# Patient Record
Sex: Female | Born: 1950
Health system: Southern US, Community
[De-identification: ages and names within clinical notes are randomized; demographics above are authoritative.]

## PROBLEM LIST (undated history)

## (undated) DIAGNOSIS — B019 Varicella without complication: Secondary | ICD-10-CM

## (undated) DIAGNOSIS — R011 Cardiac murmur, unspecified: Secondary | ICD-10-CM

## (undated) DIAGNOSIS — E7800 Pure hypercholesterolemia, unspecified: Secondary | ICD-10-CM

## (undated) DIAGNOSIS — N390 Urinary tract infection, site not specified: Secondary | ICD-10-CM

## (undated) HISTORY — PX: OTHER SURGICAL HISTORY: SHX169

## (undated) HISTORY — DX: Cardiac murmur, unspecified: R01.1

## (undated) HISTORY — DX: Pure hypercholesterolemia, unspecified: E78.00

## (undated) HISTORY — DX: Varicella without complication: B01.9

## (undated) HISTORY — DX: Urinary tract infection, site not specified: N39.0

---

## 1994-03-14 HISTORY — PX: ABDOMINAL HYSTERECTOMY: SHX81

## 2004-04-30 ENCOUNTER — Ambulatory Visit: Payer: Self-pay | Admitting: Gastroenterology

## 2005-10-04 ENCOUNTER — Ambulatory Visit: Payer: Self-pay

## 2006-07-19 ENCOUNTER — Emergency Department: Payer: Self-pay | Admitting: Emergency Medicine

## 2012-05-29 ENCOUNTER — Encounter: Payer: Self-pay | Admitting: Adult Health

## 2012-05-29 ENCOUNTER — Ambulatory Visit (INDEPENDENT_AMBULATORY_CARE_PROVIDER_SITE_OTHER): Payer: 59 | Admitting: Adult Health

## 2012-05-29 VITALS — BP 148/86 | HR 86 | Temp 98.4°F | Resp 14 | Ht 66.0 in | Wt 204.0 lb

## 2012-05-29 DIAGNOSIS — G629 Polyneuropathy, unspecified: Secondary | ICD-10-CM

## 2012-05-29 DIAGNOSIS — R200 Anesthesia of skin: Secondary | ICD-10-CM | POA: Insufficient documentation

## 2012-05-29 DIAGNOSIS — R209 Unspecified disturbances of skin sensation: Secondary | ICD-10-CM

## 2012-05-29 DIAGNOSIS — Z1239 Encounter for other screening for malignant neoplasm of breast: Secondary | ICD-10-CM

## 2012-05-29 DIAGNOSIS — Z0000 Encounter for general adult medical examination without abnormal findings: Secondary | ICD-10-CM

## 2012-05-29 DIAGNOSIS — Z1211 Encounter for screening for malignant neoplasm of colon: Secondary | ICD-10-CM

## 2012-05-29 DIAGNOSIS — G589 Mononeuropathy, unspecified: Secondary | ICD-10-CM

## 2012-05-29 DIAGNOSIS — Z13820 Encounter for screening for osteoporosis: Secondary | ICD-10-CM

## 2012-05-29 HISTORY — DX: Anesthesia of skin: R20.0

## 2012-05-29 MED ORDER — FLUTICASONE PROPIONATE 50 MCG/ACT NA SUSP: 2.0000 | Freq: Every day | NASAL | Status: AC

## 2012-05-29 NOTE — Patient Instructions (Addendum)
  Thank you for choosing [place] at [facility] for your health care needs.  Please return at your earliest convenience for fasting labs.  Once the results are available I will contact you.  Our office will contact you with the following:   Referral to GI for screening colonoscopy  Appointment for Mammogram  Appointment for Bone Density Exam

## 2012-05-29 NOTE — Assessment & Plan Note (Signed)
Numbness and tingling bilateral feet. Mainly worse in the evening. Will check B12 level

## 2012-05-29 NOTE — Assessment & Plan Note (Signed)
Mammography ordered

## 2012-05-29 NOTE — Progress Notes (Signed)
  Subjective:    Patient ID: Jody Huffman, female    DOB: 17-Feb-1951, 62 y.o.   MRN: [id number]  HPI  Patient is a pleasant 62 year old female who presents to clinic to establish care. She reports she has not seen a provider in several years. 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 She is feeling well overall.  Health Maintenance:  Flu shot - 04/2012  Colonoscopy - greater than 10 years. Needs referral  Tdap - Needs  Mammogram - Needs  PAP - Normal. Patient had total hysterectomy 1996  Exercise - Uses stationary bicycle and walks when weather permits.  Depression Screen - No s/s of hopelessness, no distress, no anhedonia. Patient denies any suicidal ideations.    Review of Systems  Constitutional: Negative for appetite change and fatigue.  HENT: Positive for rhinorrhea, voice change, postnasal drip and sinus pressure. Negative for sore throat and trouble swallowing.        Ear pressure  Eyes: Positive for itching.       Watery eyes during allergy season  Respiratory: Negative for shortness of breath and wheezing.   Cardiovascular: Negative for chest pain and leg swelling.  Gastrointestinal: Positive for constipation. Negative for nausea, vomiting, abdominal pain, diarrhea and blood in stool.       External hemorrhoids  Endocrine: Negative for cold intolerance, heat intolerance, polydipsia, polyphagia and polyuria.  Genitourinary: Negative for dysuria, urgency, hematuria, vaginal bleeding and difficulty urinating.  Musculoskeletal: Negative.   Skin: Negative.   Allergic/Immunologic: Positive for environmental allergies. Negative for food allergies and immunocompromised state.  Neurological: Positive for numbness. Negative for dizziness, light-headedness and headaches.       Tingling bilateral feet.  Hematological: Negative.     BP 148/86  Pulse 86  Temp(Src) 98.4 F (36.9 C)  Resp 14  Ht 5\' 6"  (1.676 m)  Wt 204 lb (92.534 kg)  BMI 32.94  kg/m2  SpO2 99%     Objective:   Physical Exam  Constitutional: She is oriented to person, place, and time. She appears well-developed and well-nourished. No distress.  HENT:  Head: Normocephalic and atraumatic.  Right Ear: External ear normal.  Left Ear: External ear normal.  Nose: Nose normal.  Mouth/Throat: Oropharynx is clear and moist.  Eyes: Conjunctivae and EOM are normal. Pupils are equal, round, and reactive to light.  Neck: Normal range of motion. Neck supple. No tracheal deviation present. No thyromegaly present.  Cardiovascular: Normal rate, regular rhythm, normal heart sounds and intact distal pulses.  Exam reveals no gallop.   No murmur heard. Pulmonary/Chest: Effort normal and breath sounds normal. No respiratory distress. She has no wheezes. She has no rales.  Abdominal: Soft. Bowel sounds are normal. She exhibits no distension. There is no tenderness. There is no rebound and no guarding.  Musculoskeletal: Normal range of motion. She exhibits no edema and no tenderness.  Lymphadenopathy:    She has no cervical adenopathy.  Neurological: She is alert and oriented to person, place, and time. No cranial nerve deficit. Coordination normal.  Skin: Skin is warm and dry. She is not diaphoretic.  Psychiatric: She has a normal mood and affect. Her behavior is normal. Judgment and thought content normal.          Assessment & Plan:

## 2012-05-29 NOTE — Assessment & Plan Note (Signed)
Greater than 10 years since previous colonoscopy. Refer to GI for screening.

## 2012-05-29 NOTE — Assessment & Plan Note (Addendum)
Normal physical exam. Labs: CBC with differential, comprehensive metabolic panel, TSH, lipid panel, vitamin D level. Patient is not fasting and will be returning for fasting labs. I am also referring patient to GI for screening colonoscopy. Mammogram ordered. Patient will schedule appointment for pelvic. She no longer needs Pap smears.

## 2012-06-14 ENCOUNTER — Other Ambulatory Visit (INDEPENDENT_AMBULATORY_CARE_PROVIDER_SITE_OTHER): Payer: 59

## 2012-06-14 DIAGNOSIS — G589 Mononeuropathy, unspecified: Secondary | ICD-10-CM

## 2012-06-14 DIAGNOSIS — Z0000 Encounter for general adult medical examination without abnormal findings: Secondary | ICD-10-CM

## 2012-06-14 DIAGNOSIS — G629 Polyneuropathy, unspecified: Secondary | ICD-10-CM

## 2012-06-14 LAB — COMPREHENSIVE METABOLIC PANEL
ALT: 45 U/L — ABNORMAL HIGH (ref 0–35)
AST: 50 U/L — ABNORMAL HIGH (ref 0–37)
Calcium: 9.2 mg/dL (ref 8.4–10.5)
Chloride: 99 mEq/L (ref 96–112)
Creatinine, Ser: 0.7 mg/dL (ref 0.4–1.2)
Potassium: 3.6 mEq/L (ref 3.5–5.1)

## 2012-06-14 LAB — CBC WITH DIFFERENTIAL/PLATELET
Basophils Absolute: 0 10*3/uL (ref 0.0–0.1)
Eosinophils Absolute: 0.1 10*3/uL (ref 0.0–0.7)
Lymphocytes Relative: 41.2 % (ref 12.0–46.0)
MCHC: 32.8 g/dL (ref 30.0–36.0)
Neutro Abs: 2.6 10*3/uL (ref 1.4–7.7)
Neutrophils Relative %: 51.3 % (ref 43.0–77.0)
Platelets: 273 10*3/uL (ref 150.0–400.0)
RDW: 14.8 % — ABNORMAL HIGH (ref 11.5–14.6)

## 2012-06-14 LAB — LIPID PANEL
Total CHOL/HDL Ratio: 4
Triglycerides: 116 mg/dL (ref 0.0–149.0)

## 2012-06-14 LAB — LDL CHOLESTEROL, DIRECT: Direct LDL: 185.9 mg/dL

## 2012-06-15 LAB — VITAMIN D 25 HYDROXY (VIT D DEFICIENCY, FRACTURES): Vit D, 25-Hydroxy: 47 ng/mL (ref 30–89)

## 2012-07-03 ENCOUNTER — Ambulatory Visit: Payer: Self-pay | Admitting: Internal Medicine

## 2012-07-08 ENCOUNTER — Telehealth: Payer: Self-pay | Admitting: Internal Medicine

## 2012-07-12 ENCOUNTER — Telehealth: Payer: Self-pay | Admitting: Adult Health

## 2012-07-12 NOTE — Telephone Encounter (Signed)
Bone Density Scan was normal. Continue calcium and vitamin D supplement.

## 2012-07-12 NOTE — Telephone Encounter (Signed)
Pt notified of results

## 2012-07-13 ENCOUNTER — Other Ambulatory Visit: Payer: Self-pay | Admitting: Adult Health

## 2012-07-13 ENCOUNTER — Telehealth: Payer: Self-pay | Admitting: *Deleted

## 2012-07-13 DIAGNOSIS — R899 Unspecified abnormal finding in specimens from other organs, systems and tissues: Secondary | ICD-10-CM

## 2012-07-13 NOTE — Telephone Encounter (Signed)
PT is coming in for labs Monday 05.05.2014 what labs and dx would you like?  Thank you

## 2012-07-13 NOTE — Telephone Encounter (Signed)
Repeat cmet - abnormal lab result. I already entered the order. Thanks.

## 2012-07-16 ENCOUNTER — Other Ambulatory Visit (INDEPENDENT_AMBULATORY_CARE_PROVIDER_SITE_OTHER): Payer: 59

## 2012-07-16 DIAGNOSIS — R6889 Other general symptoms and signs: Secondary | ICD-10-CM

## 2012-07-16 DIAGNOSIS — R899 Unspecified abnormal finding in specimens from other organs, systems and tissues: Secondary | ICD-10-CM

## 2012-07-16 LAB — COMPREHENSIVE METABOLIC PANEL
ALT: 26 U/L (ref 0–35)
Albumin: 4.1 g/dL (ref 3.5–5.2)
CO2: 26 mEq/L (ref 19–32)
GFR: 104.58 mL/min (ref >60.00)
Glucose, Bld: 111 mg/dL — ABNORMAL HIGH (ref 70–99)
Potassium: 4.1 mEq/L (ref 3.5–5.1)
Sodium: 138 mEq/L (ref 135–145)
Total Bilirubin: 0.6 mg/dL (ref 0.3–1.2)
Total Protein: 7.7 g/dL (ref 6.0–8.3)

## 2012-07-17 ENCOUNTER — Other Ambulatory Visit: Payer: Self-pay | Admitting: Adult Health

## 2012-07-17 DIAGNOSIS — R7309 Other abnormal glucose: Secondary | ICD-10-CM

## 2012-07-30 ENCOUNTER — Encounter: Payer: Self-pay | Admitting: Internal Medicine

## 2012-08-03 ENCOUNTER — Other Ambulatory Visit (INDEPENDENT_AMBULATORY_CARE_PROVIDER_SITE_OTHER): Payer: 59

## 2012-08-03 DIAGNOSIS — R7309 Other abnormal glucose: Secondary | ICD-10-CM

## 2012-08-03 LAB — HEMOGLOBIN A1C: Hgb A1c MFr Bld: 6.5 % (ref 4.6–6.5)

## 2012-08-08 ENCOUNTER — Encounter: Payer: Self-pay | Admitting: Internal Medicine

## 2012-11-20 ENCOUNTER — Ambulatory Visit (INDEPENDENT_AMBULATORY_CARE_PROVIDER_SITE_OTHER): Payer: 59 | Admitting: Adult Health

## 2012-11-20 ENCOUNTER — Encounter: Payer: Self-pay | Admitting: Adult Health

## 2012-11-20 VITALS — BP 112/78 | HR 79 | Temp 98.3°F | Resp 12 | Ht 66.0 in | Wt 193.0 lb

## 2012-11-20 DIAGNOSIS — M25519 Pain in unspecified shoulder: Secondary | ICD-10-CM

## 2012-11-20 HISTORY — DX: Pain in unspecified shoulder: M25.519

## 2012-11-20 MED ORDER — METHOCARBAMOL 750 MG PO TABS: 750.0000 mg | ORAL_TABLET | Freq: Three times a day (TID) | ORAL | Status: DC

## 2012-11-20 MED ORDER — NAPROXEN 500 MG PO TABS: 500.0000 mg | ORAL_TABLET | Freq: Two times a day (BID) | ORAL | Status: DC

## 2012-11-20 NOTE — Assessment & Plan Note (Addendum)
Bilateral shoulder pain with radiating to elbow with abduction of arms. Suspect tendonitis. Start Naproxen and Robaxin. Ice area for 15 min. 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 Will have ortho see her to evaluate if steroid injection may benefit. 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

## 2012-11-20 NOTE — Progress Notes (Signed)
  Subjective:    Patient ID: Jody Huffman, female    DOB: 08-30-1950, 62 y.o.   MRN: [id number]  HPI  Jody Huffman presents with bilateral shoulder pain which radiate down to the elbow. 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 The pain has been ongoing for approximately one month. This pain did not begin until she started doing this job. She normally works in the [employer]. Pain has been waking her up at night. She has used heat pads and has also taken ibuprofen but only takes the edge off. Pain not fully relieved.   Current Outpatient Prescriptions on File Prior to Visit  Medication Sig Dispense Refill  . Calcium Carbonate-Vitamin D (CALCIUM-VITAMIN D3 PO) Take by mouth.      . Chromium-Cinnamon (CINNAMON PLUS CHROMIUM PO) Take by mouth.      . fluticasone (FLONASE) 50 MCG/ACT nasal spray Place 2 sprays into the nose daily.  16 g  6  . Multiple Vitamins-Minerals (WOMENS MULTI VITAMIN & MINERAL PO) Take by mouth.      . OMEGA-3 KRILL OIL PO Take by mouth.       No current facility-administered medications on file prior to visit.    Review of Systems  Musculoskeletal:       Bilateral shoulder pain with radiation into the elbow.      BP 112/78  Pulse 79  Temp(Src) 98.3 F (36.8 C) (Oral)  Resp 12  Ht 5\' 6"  (1.676 m)  Wt 193 lb (87.544 kg)  BMI 31.17 kg/m2  SpO2 98%     Objective:   Physical Exam  Constitutional: She is oriented to person, place, and time. She appears well-developed and well-nourished.  Cardiovascular: Normal rate and regular rhythm.   Pulmonary/Chest: Effort normal. No respiratory distress.  Musculoskeletal:  Pain with abduction. Pain was at shoulder area. This is occuring bilaterally.  Neurological: She is alert and oriented to person, place, and time.  Psychiatric: She has a normal mood and affect. Her behavior is normal. Judgment and thought content normal.           Assessment & Plan:

## 2012-11-20 NOTE — Patient Instructions (Addendum)
Rotator Cuff Tendonitis  The rotator cuff is the collection of all the muscles and tendons (the supraspinatus, infraspinatus, subscapularis, and teres minor muscles and their tendons) that help your shoulder stay in place. This unit holds the head of the upper arm bone (humerus) in the cup (fossa) of the shoulder blade (scapula). Basically, it connects the arm to the shoulder. Tendinitis is a swelling and irritation of the tissue, called cord like structures (tendons) that connect muscle to bone. It usually is caused by overusing the joint involved. When the tissue surrounding a tendon (the synovium) becomes inflamed, it is called tenosynovitis. This also is often the result of overuse in people whose jobs require repetitive (over and over again) types of motion. HOME CARE INSTRUCTIONS   Use a sling or splint for as long as directed by your caregiver until the pain decreases.  Apply ice to the injury for 15-20 minutes, 3-4 times per day. Put the ice in a plastic bag and place a towel between the bag of ice and your skin.  Try to avoid use other than gentle range of motion while your shoulder is painful. Use and exercise only as directed by your caregiver. Stop exercises or range of motion if pain or discomfort increases, unless directed otherwise by your caregiver.  Only take over-the-counter or prescription medicines for pain, discomfort, or fever as directed by your caregiver.  If you were give a shoulder sling and straps (immobilizer), do not remove it except as directed, or until you see a caregiver for a follow-up examination. If you need to remove it, move your arm as little as possible or as directed.  You may want to sleep on several pillows at night to lessen swelling and pain. SEEK IMMEDIATE MEDICAL CARE IF:   Pain in your shoulder increases or new pain develops in your arm, hand, or fingers and is not relieved with medications.  You develop new, unexplained symptoms, especially  increased numbness in the hands or loss of strength, or you develop any worsening of the problems which brought you in for care.  Your arm, hand, or fingers are numb or tingling.  Your arm, hand, or fingers are swollen, painful, or turn white or blue. Document Released: 05/21/2003 Document Revised: 05/23/2011 Document Reviewed: 12/27/2007 Exit[employer] Patient Information 2014 [employer].  

## 2013-01-29 ENCOUNTER — Other Ambulatory Visit: Payer: Self-pay | Admitting: Adult Health

## 2013-09-17 ENCOUNTER — Telehealth: Payer: Self-pay

## 2013-09-17 NOTE — Telephone Encounter (Signed)
Pt is scheduled at [facility] for August 4th at 4:15 for her diagnostic mammogram.  (pt's last was April 2014)

## 2013-09-18 NOTE — Telephone Encounter (Signed)
Thank you :)

## 2013-10-07 ENCOUNTER — Encounter: Payer: 59 | Admitting: Adult Health

## 2013-10-08 ENCOUNTER — Encounter: Payer: Self-pay | Admitting: Adult Health

## 2013-10-08 ENCOUNTER — Ambulatory Visit (INDEPENDENT_AMBULATORY_CARE_PROVIDER_SITE_OTHER)
Admission: RE | Admit: 2013-10-08 | Discharge: 2013-10-08 | Disposition: A | Discharge status: Home / Self Care | Payer: 59 | Source: Ambulatory Visit | Attending: Adult Health | Admitting: Adult Health

## 2013-10-08 ENCOUNTER — Ambulatory Visit (INDEPENDENT_AMBULATORY_CARE_PROVIDER_SITE_OTHER): Payer: 59 | Admitting: Adult Health

## 2013-10-08 VITALS — BP 138/85 | HR 78 | Temp 98.0°F | Resp 14 | Ht 66.5 in | Wt 194.0 lb

## 2013-10-08 DIAGNOSIS — M79609 Pain in unspecified limb: Secondary | ICD-10-CM

## 2013-10-08 DIAGNOSIS — R209 Unspecified disturbances of skin sensation: Secondary | ICD-10-CM

## 2013-10-08 DIAGNOSIS — R200 Anesthesia of skin: Secondary | ICD-10-CM

## 2013-10-08 DIAGNOSIS — M79671 Pain in right foot: Secondary | ICD-10-CM

## 2013-10-08 DIAGNOSIS — R202 Paresthesia of skin: Secondary | ICD-10-CM

## 2013-10-08 DIAGNOSIS — Z0000 Encounter for general adult medical examination without abnormal findings: Secondary | ICD-10-CM

## 2013-10-08 DIAGNOSIS — Z23 Encounter for immunization: Secondary | ICD-10-CM

## 2013-10-08 HISTORY — DX: Pain in right foot: M79.671

## 2013-10-08 MED ORDER — TETANUS-DIPHTHERIA TOXOIDS TD 5-2 LFU IM INJ
0.5000 mL | INJECTION | Freq: Once | INTRAMUSCULAR | Status: AC
  Administered 2013-10-08: 0.5 mL via INTRAMUSCULAR

## 2013-10-08 NOTE — Patient Instructions (Signed)
  [facility] for your xray of right foot.  Please return for fasting labs at your earliest convenience.  Return in 1 year for your annual physical or sooner if necessary.  You received your tetanus vaccine in clinic today.

## 2013-10-08 NOTE — Progress Notes (Signed)
Patient ID: Jody MartinezLinda A Brockel, female   DOB: 10/06/1950, [age] y.o.   MRN: [id number]   Subjective:    Patient ID: Jody MartinezLinda A Cottrill, female    DOB: 12/27/1950, 63 y.o.   MRN: [id number]  HPI  Pt is a pleasant 63 y/o female who presents for her annual physical exam. Needs tetanus vaccine. Mammogram scheduled for next month. Colonoscopy scheduled for next year. Doing well overall except for having some swelling of her right foot and ankle. Pain on the top of her right foot as well. Reports that she walks on concrete all day long. No reports of injury that she is aware of. Cannot really pinpoint when her symptoms began. Numbness and tingling of toes. Reports "strong" family hx of diabetes in her family.   Past Medical History  Diagnosis Date  . Chicken pox   . Heart murmur   . High cholesterol   . UTI (lower urinary tract infection)     Past Surgical History  Procedure Laterality Date  . Abdominal hysterectomy  1996  . Right breast cyst removed      In her 30s    Family History  Problem Relation Age of Onset  . Diabetes Mother   . Kidney disease Mother 165    dialysis  . Cancer Father [id number]    pancreatic cancer  . Kidney disease Sister 543    dialysis  . Diabetes Brother     History   Social History  . Marital Status: Divorced    Spouse Name: N/A    Number of Children: N/A  . Years of Education: N/A   Occupational History  . Not on file.   Social History Main Topics  . Smoking status: Never Smoker   . Smokeless tobacco: Not on file  . Alcohol Use: No  . Drug Use: No  . Sexual Activity: Not on file   Other Topics Concern  . Not on file   Social History Narrative  . No narrative on file    Current Outpatient Prescriptions on File Prior to Visit  Medication Sig Dispense Refill  . Calcium Carbonate-Vitamin D (CALCIUM-VITAMIN D3 PO) Take by mouth.      . cetirizine (ZYRTEC) 10 MG tablet Take 10 mg by mouth daily.      . Chromium-Cinnamon (CINNAMON PLUS CHROMIUM PO) Take by mouth.       . fluticasone (FLONASE) 50 MCG/ACT nasal spray Place 2 sprays into the nose daily.  16 g  6  . Multiple Vitamins-Minerals (WOMENS MULTI VITAMIN & MINERAL PO) Take by mouth.      . OMEGA-3 KRILL OIL PO Take by mouth.       No current facility-administered medications on file prior to visit.    Review of Systems  Constitutional: Negative.   HENT: Negative.   Eyes: Negative.   Respiratory: Negative.   Cardiovascular: Negative.   Gastrointestinal: Negative.   Endocrine: Negative.   Genitourinary: Negative.   Musculoskeletal: Positive for arthralgias.       Right foot pain, ankle swelling  Skin: Negative.   Allergic/Immunologic: Negative.   Neurological: Positive for numbness (and tingling of bilateral feet).  Hematological: Negative.   Psychiatric/Behavioral: Negative.        Objective:  BP 138/85  Pulse 78  Temp(Src) 98 F (36.7 C) (Oral)  Resp 14  Ht 5' 6.5" (1.689 m)  Wt 194 lb (87.998 kg)  BMI 30.85 kg/m2  SpO2 99%   Physical Exam  Constitutional: She is oriented to person,  place, and time. She appears well-developed and well-nourished. No distress.  HENT:  Head: Normocephalic and atraumatic.  Right Ear: External ear normal.  Left Ear: External ear normal.  Nose: Nose normal.  Mouth/Throat: Oropharynx is clear and moist.  Eyes: Conjunctivae and EOM are normal. Pupils are equal, round, and reactive to light.  Neck: Normal range of motion. Neck supple. No tracheal deviation present. No thyromegaly present.  Cardiovascular: Normal rate, regular rhythm, normal heart sounds and intact distal pulses.  Exam reveals no gallop and no friction rub.   No murmur heard. Pulmonary/Chest: Effort normal and breath sounds normal. No respiratory distress. She has no wheezes. She has no rales. Right breast exhibits no inverted nipple, no mass, no nipple discharge, no skin change and no tenderness. Left breast exhibits no inverted nipple, no mass, no nipple discharge, no skin change  and no tenderness. Breasts are symmetrical.  Abdominal: Soft. Bowel sounds are normal. She exhibits no distension and no mass. There is no tenderness. There is no rebound and no guarding.  Musculoskeletal: Normal range of motion. She exhibits edema and tenderness (right foot pain).  Right ankle swelling  Lymphadenopathy:    She has no cervical adenopathy.  Neurological: She is alert and oriented to person, place, and time. She has normal reflexes. No cranial nerve deficit. Coordination normal.  Skin: Skin is warm and dry.  Psychiatric: She has a normal mood and affect. Her behavior is normal. Judgment and thought content normal.      Assessment & Plan:   1. Routine general medical examination at a health care facility Normal physical exam including breast exam. Problems listed separately. Screenings addressed and listed separately as indicated. Check labs. - Lipid panel - CBC with Differential - TSH - Vit D  25 hydroxy (rtn osteoporosis monitoring) - Comprehensive metabolic panel - tetanus & diphtheria toxoids (adult) (TENIVAC) injection 0.5 mL; Inject 0.5 mLs into the muscle once.  2. Right foot pain Walks on concrete all day. ?stress fracture. Send for xray - DG Foot Complete Right; Future  3. Numbness and tingling Will check labs to r/o deficiency as cause of symptom - Folate - Vitamin B12 - Ferritin - Iron  4. Need for prophylactic vaccination with combined diphtheria-tetanus-pertussis (DTP) vaccine Given in clinic today. - tetanus & diphtheria toxoids (adult) (TENIVAC) injection 0.5 mL; Inject 0.5 mLs into the muscle once.

## 2013-10-08 NOTE — Progress Notes (Signed)
Pre visit review using our clinic review tool, if applicable. No additional management support is needed unless otherwise documented below in the visit note. 

## 2013-10-10 ENCOUNTER — Other Ambulatory Visit (INDEPENDENT_AMBULATORY_CARE_PROVIDER_SITE_OTHER): Payer: 59

## 2013-10-10 ENCOUNTER — Other Ambulatory Visit: Payer: Self-pay | Admitting: Adult Health

## 2013-10-10 DIAGNOSIS — R200 Anesthesia of skin: Secondary | ICD-10-CM

## 2013-10-10 DIAGNOSIS — R209 Unspecified disturbances of skin sensation: Secondary | ICD-10-CM

## 2013-10-10 DIAGNOSIS — Z0000 Encounter for general adult medical examination without abnormal findings: Secondary | ICD-10-CM

## 2013-10-10 DIAGNOSIS — R202 Paresthesia of skin: Principal | ICD-10-CM

## 2013-10-10 LAB — LIPID PANEL
CHOL/HDL RATIO: 4
Cholesterol: 254 mg/dL — ABNORMAL HIGH (ref 0–200)
HDL: 66.6 mg/dL (ref >39.00)
LDL CALC: 170 mg/dL — AB (ref 0–99)
NONHDL: 187.4
Triglycerides: 89 mg/dL (ref 0.0–149.0)
VLDL: 17.8 mg/dL (ref 0.0–40.0)

## 2013-10-10 LAB — CBC WITH DIFFERENTIAL/PLATELET
Basophils Absolute: 0 10*3/uL (ref 0.0–0.1)
Basophils Relative: 0.4 % (ref 0.0–3.0)
EOS PCT: 0 % (ref 0.0–5.0)
Eosinophils Absolute: 0 10*3/uL (ref 0.0–0.7)
HCT: 36.2 % (ref 36.0–46.0)
HEMOGLOBIN: 12.1 g/dL (ref 12.0–15.0)
LYMPHS ABS: 1.8 10*3/uL (ref 0.7–4.0)
Lymphocytes Relative: 20.9 % (ref 12.0–46.0)
MCHC: 33.5 g/dL (ref 30.0–36.0)
MCV: 91.2 fl (ref 78.0–100.0)
MONOS PCT: 4.8 % (ref 3.0–12.0)
Monocytes Absolute: 0.4 10*3/uL (ref 0.1–1.0)
NEUTROS ABS: 6.4 10*3/uL (ref 1.4–7.7)
Neutrophils Relative %: 73.9 % (ref 43.0–77.0)
Platelets: 247 10*3/uL (ref 150.0–400.0)
RBC: 3.97 Mil/uL (ref 3.87–5.11)
RDW: 14.6 % (ref 11.5–15.5)
WBC: 8.6 10*3/uL (ref 4.0–10.5)

## 2013-10-10 LAB — COMPREHENSIVE METABOLIC PANEL
ALBUMIN: 4 g/dL (ref 3.5–5.2)
ALT: 24 U/L (ref 0–35)
AST: 33 U/L (ref 0–37)
Alkaline Phosphatase: 82 U/L (ref 39–117)
BUN: 12 mg/dL (ref 6–23)
CO2: 28 mEq/L (ref 19–32)
Calcium: 9.2 mg/dL (ref 8.4–10.5)
Chloride: 97 mEq/L (ref 96–112)
Creatinine, Ser: 0.8 mg/dL (ref 0.4–1.2)
GFR: 97.93 mL/min (ref >60.00)
Glucose, Bld: 115 mg/dL — ABNORMAL HIGH (ref 70–99)
Potassium: 4 mEq/L (ref 3.5–5.1)
Sodium: 134 mEq/L — ABNORMAL LOW (ref 135–145)
Total Bilirubin: 0.7 mg/dL (ref 0.2–1.2)
Total Protein: 7.4 g/dL (ref 6.0–8.3)

## 2013-10-10 LAB — IRON: Iron: 17 ug/dL — ABNORMAL LOW (ref 42–145)

## 2013-10-10 LAB — VITAMIN D 25 HYDROXY (VIT D DEFICIENCY, FRACTURES): VITD: 31.95 ng/mL (ref 30.00–100.00)

## 2013-10-10 LAB — FERRITIN: Ferritin: 102.9 ng/mL (ref 10.0–291.0)

## 2013-10-10 LAB — TSH: TSH: 0.52 u[IU]/mL (ref 0.35–4.50)

## 2013-10-10 LAB — VITAMIN B12: VITAMIN B 12: 713 pg/mL (ref 211–911)

## 2013-10-10 LAB — FOLATE: Folate: >24.8 ng/mL (ref >5.9)

## 2013-10-10 MED ORDER — ATORVASTATIN CALCIUM 10 MG PO TABS: 10.0000 mg | ORAL_TABLET | Freq: Every day | ORAL | Status: DC

## 2013-10-15 ENCOUNTER — Ambulatory Visit: Payer: Self-pay | Admitting: Adult Health

## 2013-10-15 LAB — HM MAMMOGRAPHY

## 2013-10-16 ENCOUNTER — Encounter: Payer: Self-pay | Admitting: *Deleted

## 2013-10-18 ENCOUNTER — Telehealth: Payer: Self-pay

## 2013-10-18 NOTE — Telephone Encounter (Signed)
What has she tried? Arthritis pain is relieved by anti-inflammatory medications such as ibuprofen, aleve, motrin. There is a tylenol arthritis pain medication also available.

## 2013-10-18 NOTE — Telephone Encounter (Signed)
Per Raquel, called patient to see if she was notified that add'l films of her right breast was needed. Patient stated yes she was told and has an appt schedule for 10/24/13. Patient also stated "while I have you on the phone can you tell Raquel I am still having pain in my  Right foot. Is there any thing she can prescribe me to help relieve the pain because it is killing me. X ray done 10/08/13 showed arthritis of the right foot. Please advise.

## 2013-10-23 NOTE — Telephone Encounter (Signed)
Spoke to patient to notify her of Raquel comments. Patient verbalized understanding. Patient stated she has tried ibuprofen without much relief. I informed patient that she could try tylenol arthritis for pain and patient agreed to try. Patient stated she would call me in 2 weeks after trying the tylenol arthritis as needed to let me know if it is helping with her pain.

## 2013-10-24 ENCOUNTER — Ambulatory Visit: Payer: Self-pay | Admitting: Adult Health

## 2013-10-24 LAB — HM MAMMOGRAPHY

## 2013-10-25 ENCOUNTER — Other Ambulatory Visit: Payer: Self-pay | Admitting: Adult Health

## 2013-10-25 DIAGNOSIS — R921 Mammographic calcification found on diagnostic imaging of breast: Secondary | ICD-10-CM

## 2013-11-11 ENCOUNTER — Encounter: Payer: Self-pay | Admitting: Adult Health

## 2014-01-07 ENCOUNTER — Telehealth: Payer: Self-pay | Admitting: *Deleted

## 2014-01-07 DIAGNOSIS — E785 Hyperlipidemia, unspecified: Secondary | ICD-10-CM

## 2014-01-07 DIAGNOSIS — Z79899 Other long term (current) drug therapy: Secondary | ICD-10-CM

## 2014-01-07 NOTE — Telephone Encounter (Signed)
What labs and dx?  

## 2014-01-08 ENCOUNTER — Other Ambulatory Visit (INDEPENDENT_AMBULATORY_CARE_PROVIDER_SITE_OTHER): Payer: 59

## 2014-01-08 DIAGNOSIS — R748 Abnormal levels of other serum enzymes: Secondary | ICD-10-CM

## 2014-01-08 DIAGNOSIS — Z79899 Other long term (current) drug therapy: Secondary | ICD-10-CM

## 2014-01-08 DIAGNOSIS — E785 Hyperlipidemia, unspecified: Secondary | ICD-10-CM

## 2014-01-08 LAB — COMPREHENSIVE METABOLIC PANEL
ALBUMIN: 3.7 g/dL (ref 3.5–5.2)
ALT: 37 U/L — ABNORMAL HIGH (ref 0–35)
AST: 41 U/L — ABNORMAL HIGH (ref 0–37)
Alkaline Phosphatase: 98 U/L (ref 39–117)
BUN: 14 mg/dL (ref 6–23)
CHLORIDE: 100 meq/L (ref 96–112)
CO2: 33 meq/L — AB (ref 19–32)
Calcium: 9.6 mg/dL (ref 8.4–10.5)
Creatinine, Ser: 0.7 mg/dL (ref 0.4–1.2)
GFR: 107.46 mL/min (ref >60.00)
GLUCOSE: 95 mg/dL (ref 70–99)
POTASSIUM: 4.1 meq/L (ref 3.5–5.1)
SODIUM: 139 meq/L (ref 135–145)
TOTAL PROTEIN: 7.4 g/dL (ref 6.0–8.3)
Total Bilirubin: 0.9 mg/dL (ref 0.2–1.2)

## 2014-01-09 LAB — LIPID PANEL W/DIRECT LDL/HDL RATIO
Cholesterol: 212 mg/dL — ABNORMAL HIGH (ref 0–200)
HDL: 67 mg/dL (ref >39)
LDL DIRECT: 120 mg/dL — AB
LDL:HDL Ratio: 1.8 Ratio
Total Chol/HDL Ratio: 3.2 Ratio
Triglycerides: 119 mg/dL (ref <150)

## 2014-01-11 NOTE — Addendum Note (Signed)
Addended by: Sherlene ShamsULLO, Leane Loring L on: 01/11/2014 02:29 PM   Modules accepted: Orders

## 2014-02-14 ENCOUNTER — Other Ambulatory Visit (INDEPENDENT_AMBULATORY_CARE_PROVIDER_SITE_OTHER): Payer: 59

## 2014-02-14 DIAGNOSIS — R748 Abnormal levels of other serum enzymes: Secondary | ICD-10-CM

## 2014-02-15 LAB — HEPATIC FUNCTION PANEL
ALT: 24 U/L (ref 0–35)
AST: 24 U/L (ref 0–37)
Albumin: 4.3 g/dL (ref 3.5–5.2)
Alkaline Phosphatase: 88 U/L (ref 39–117)
BILIRUBIN DIRECT: 0.1 mg/dL (ref 0.0–0.3)
BILIRUBIN TOTAL: 0.7 mg/dL (ref 0.2–1.2)
Indirect Bilirubin: 0.6 mg/dL (ref 0.2–1.2)
Total Protein: 7.3 g/dL (ref 6.0–8.3)

## 2014-02-15 LAB — HEPATITIS B SURFACE ANTIGEN: Hepatitis B Surface Ag: NEGATIVE

## 2014-02-15 LAB — FERRITIN: FERRITIN: 134 ng/mL (ref 10–291)

## 2014-02-15 LAB — IRON AND TIBC
%SAT: 24 % (ref 20–55)
Iron: 94 ug/dL (ref 42–145)
TIBC: 385 ug/dL (ref 250–470)
UIBC: 291 ug/dL (ref 125–400)

## 2014-02-15 LAB — HEPATITIS B CORE ANTIBODY, TOTAL: Hep B Core Total Ab: NONREACTIVE

## 2014-02-15 LAB — HEPATITIS C ANTIBODY: HCV Ab: NEGATIVE

## 2014-02-15 LAB — HEPATITIS B SURFACE ANTIBODY,QUALITATIVE: Hep B S Ab: NEGATIVE

## 2014-02-17 LAB — ANTI-SMITH ANTIBODY: ENA SM AB SER-ACNC: NEGATIVE

## 2014-02-17 LAB — ANA: ANA: NEGATIVE

## 2014-02-20 ENCOUNTER — Encounter: Payer: Self-pay | Admitting: *Deleted

## 2014-03-14 HISTORY — PX: BREAST BIOPSY: SHX20

## 2014-03-31 ENCOUNTER — Other Ambulatory Visit: Payer: Self-pay | Admitting: *Deleted

## 2014-03-31 MED ORDER — ATORVASTATIN CALCIUM 10 MG PO TABS: 10.0000 mg | ORAL_TABLET | Freq: Every day | ORAL | Status: DC

## 2014-09-13 ENCOUNTER — Other Ambulatory Visit: Payer: Self-pay | Admitting: Nurse Practitioner

## 2014-09-16 NOTE — Telephone Encounter (Signed)
Last OV 7.28.15 with Raquel.  Please advise refill

## 2014-09-16 NOTE — Telephone Encounter (Signed)
Left detailed message on VM requesting a call back to schedule appoint

## 2014-10-08 ENCOUNTER — Encounter: Payer: Self-pay | Admitting: Nurse Practitioner

## 2014-10-08 ENCOUNTER — Ambulatory Visit (INDEPENDENT_AMBULATORY_CARE_PROVIDER_SITE_OTHER): Payer: Self-pay | Admitting: Nurse Practitioner

## 2014-10-08 VITALS — BP 138/82 | HR 73 | Temp 98.3°F | Resp 12 | Ht 66.5 in | Wt 215.0 lb

## 2014-10-08 DIAGNOSIS — Z0000 Encounter for general adult medical examination without abnormal findings: Secondary | ICD-10-CM

## 2014-10-08 LAB — COMPREHENSIVE METABOLIC PANEL
ALK PHOS: 97 U/L (ref 39–117)
ALT: 28 U/L (ref 0–35)
AST: 29 U/L (ref 0–37)
Albumin: 4.2 g/dL (ref 3.5–5.2)
BILIRUBIN TOTAL: 0.8 mg/dL (ref 0.2–1.2)
BUN: 15 mg/dL (ref 6–23)
CHLORIDE: 102 meq/L (ref 96–112)
CO2: 31 mEq/L (ref 19–32)
Calcium: 9.3 mg/dL (ref 8.4–10.5)
Creatinine, Ser: 0.65 mg/dL (ref 0.40–1.20)
GFR: 118.69 mL/min (ref >60.00)
GLUCOSE: 103 mg/dL — AB (ref 70–99)
POTASSIUM: 4 meq/L (ref 3.5–5.1)
SODIUM: 140 meq/L (ref 135–145)
Total Protein: 7.2 g/dL (ref 6.0–8.3)

## 2014-10-08 LAB — LIPID PANEL
Cholesterol: 234 mg/dL — ABNORMAL HIGH (ref 0–200)
HDL: 58.3 mg/dL (ref >39.00)
LDL CALC: 140 mg/dL — AB (ref 0–99)
NonHDL: 175.7
Total CHOL/HDL Ratio: 4
Triglycerides: 181 mg/dL — ABNORMAL HIGH (ref 0.0–149.0)
VLDL: 36.2 mg/dL (ref 0.0–40.0)

## 2014-10-08 LAB — CBC WITH DIFFERENTIAL/PLATELET
BASOS PCT: 0.5 % (ref 0.0–3.0)
Basophils Absolute: 0 10*3/uL (ref 0.0–0.1)
Eosinophils Absolute: 0.1 10*3/uL (ref 0.0–0.7)
Eosinophils Relative: 1.9 % (ref 0.0–5.0)
HCT: 38.4 % (ref 36.0–46.0)
Hemoglobin: 13 g/dL (ref 12.0–15.0)
LYMPHS ABS: 1.9 10*3/uL (ref 0.7–4.0)
Lymphocytes Relative: 28.5 % (ref 12.0–46.0)
MCHC: 33.8 g/dL (ref 30.0–36.0)
MCV: 89.8 fl (ref 78.0–100.0)
MONO ABS: 0.3 10*3/uL (ref 0.1–1.0)
Monocytes Relative: 5.2 % (ref 3.0–12.0)
NEUTROS PCT: 63.9 % (ref 43.0–77.0)
Neutro Abs: 4.3 10*3/uL (ref 1.4–7.7)
Platelets: 251 10*3/uL (ref 150.0–400.0)
RBC: 4.27 Mil/uL (ref 3.87–5.11)
RDW: 15 % (ref 11.5–15.5)
WBC: 6.8 10*3/uL (ref 4.0–10.5)

## 2014-10-08 LAB — HEMOGLOBIN A1C: HEMOGLOBIN A1C: 6.2 % (ref 4.6–6.5)

## 2014-10-08 LAB — TSH: TSH: 0.68 u[IU]/mL (ref 0.35–4.50)

## 2014-10-08 NOTE — Progress Notes (Signed)
Pre visit review using our clinic review tool, if applicable. No additional management support is needed unless otherwise documented below in the visit note. 

## 2014-10-08 NOTE — Progress Notes (Signed)
   Subjective:    Patient ID: Jody Huffman, female    DOB: October 17, 1950, 64 y.o.   MRN: [id number]  HPI  Pt meant to make a Lab appointment for her fasting blood work before her physical. Will reschedule physical and GYN exam for another day due to pt request.   Review of Systems     Objective:   Physical Exam  Constitutional:  BP 138/82 mmHg  Pulse 73  Temp(Src) 98.3 F (36.8 C) (Oral)  Resp 12  Ht 5' 6.5" (1.689 m)  Wt 215 lb (97.523 kg)  BMI 34.19 kg/m2  SpO2 96%  LMP            Assessment & Plan:

## 2014-10-15 ENCOUNTER — Other Ambulatory Visit: Payer: Self-pay | Admitting: Nurse Practitioner

## 2014-10-15 MED ORDER — ATORVASTATIN CALCIUM 20 MG PO TABS: 20.0000 mg | ORAL_TABLET | Freq: Every day | ORAL | Status: DC

## 2014-10-17 ENCOUNTER — Other Ambulatory Visit (HOSPITAL_COMMUNITY)
Admission: RE | Admit: 2014-10-17 | Discharge: 2014-10-17 | Disposition: A | Discharge status: Home / Self Care | Payer: 59 | Source: Ambulatory Visit | Attending: Nurse Practitioner | Admitting: Nurse Practitioner

## 2014-10-17 ENCOUNTER — Ambulatory Visit (INDEPENDENT_AMBULATORY_CARE_PROVIDER_SITE_OTHER): Payer: 59 | Admitting: Nurse Practitioner

## 2014-10-17 ENCOUNTER — Encounter: Payer: Self-pay | Admitting: Nurse Practitioner

## 2014-10-17 VITALS — BP 128/78 | HR 75 | Temp 98.7°F | Ht 66.5 in | Wt 209.2 lb

## 2014-10-17 DIAGNOSIS — Z1239 Encounter for other screening for malignant neoplasm of breast: Secondary | ICD-10-CM | POA: Diagnosis not present

## 2014-10-17 DIAGNOSIS — Z1151 Encounter for screening for human papillomavirus (HPV): Secondary | ICD-10-CM | POA: Insufficient documentation

## 2014-10-17 DIAGNOSIS — Z0000 Encounter for general adult medical examination without abnormal findings: Secondary | ICD-10-CM | POA: Diagnosis not present

## 2014-10-17 DIAGNOSIS — Z124 Encounter for screening for malignant neoplasm of cervix: Secondary | ICD-10-CM

## 2014-10-17 DIAGNOSIS — Z1211 Encounter for screening for malignant neoplasm of colon: Secondary | ICD-10-CM | POA: Diagnosis not present

## 2014-10-17 NOTE — Progress Notes (Signed)
Patient ID: Jody Huffman, female    DOB: 1950/09/17  Age: 64 y.o. MRN: [id number]  CC: Annual Exam   HPI Jody Huffman presents for an annual exam.   1) Health Maintenance-   Diet- No formal   Exercise- Walks only when it is cooler outside   Goals- Cutting down on bread and sweets, going to the [facility] 3 x a week 30 min at the least.   Immunizations- UTD, unsure about shingles  Mammogram- Needs referral   Pap- Needs today   Bone Density- 3 years until due  Colonoscopy- Needs- interested in Cologuard  Eye Exam-Has glasses, UTD 09/2014  Dental Exam- UTD History Jody Huffman has a past medical history of Chicken pox; Heart murmur; High cholesterol; and UTI (lower urinary tract infection).   She has past surgical history that includes Abdominal hysterectomy (1996) and Right breast cyst removed.   Her family history includes Cancer (age of onset: 66) in her father; Diabetes in her brother and mother; Kidney disease (age of onset: 44) in her sister; Kidney disease (age of onset: 78) in her mother.She reports that she has never smoked. She has never used smokeless tobacco. She reports that she does not drink alcohol or use illicit drugs.  Outpatient Prescriptions Prior to Visit  Medication Sig Dispense Refill  . atorvastatin (LIPITOR) 20 MG tablet Take 1 tablet (20 mg total) by mouth daily. 30 tablet 1  . Calcium Carbonate-Vitamin D (CALCIUM-VITAMIN D3 PO) Take by mouth.    . cetirizine (ZYRTEC) 10 MG tablet Take 10 mg by mouth daily.    . Chromium-Cinnamon (CINNAMON PLUS CHROMIUM PO) Take by mouth.    . fluticasone (FLONASE) 50 MCG/ACT nasal spray Place 2 sprays into the nose daily. 16 g 6  . Multiple Vitamins-Minerals (WOMENS MULTI VITAMIN & MINERAL PO) Take by mouth.    . OMEGA-3 KRILL OIL PO Take by mouth.     No facility-administered medications prior to visit.    ROS Review of Systems  Constitutional: Negative for fever, chills, diaphoresis and fatigue.  Respiratory: Negative for chest  tightness, shortness of breath and wheezing.   Cardiovascular: Negative for chest pain, palpitations and leg swelling.  Gastrointestinal: Negative for nausea, vomiting, diarrhea and rectal pain.  Skin: Negative for rash.  Neurological: Negative for dizziness, weakness, numbness and headaches.  Psychiatric/Behavioral: Negative for suicidal ideas and sleep disturbance. The patient is not nervous/anxious.     Objective:  BP 128/78 mmHg  Pulse 75  Temp(Src) 98.7 F (37.1 C) (Oral)  Ht 5' 6.5" (1.689 m)  Wt 209 lb 4 oz (94.915 kg)  BMI 33.27 kg/m2  SpO2 95%  Physical Exam  Constitutional: She is oriented to person, place, and time. She appears well-developed and well-nourished. No distress.  HENT:  Head: Normocephalic and atraumatic.  Right Ear: External ear normal.  Left Ear: External ear normal.  Nose: Nose normal.  Mouth/Throat: Oropharynx is clear and moist. No oropharyngeal exudate.  TMs and canals clear bilaterally  Eyes: Conjunctivae and EOM are normal. Pupils are equal, round, and reactive to light. Right eye exhibits no discharge. Left eye exhibits no discharge. No scleral icterus.  Neck: Normal range of motion. Neck supple. No thyromegaly present.  Cardiovascular: Normal rate, regular rhythm and intact distal pulses.  Exam reveals no gallop and no friction rub.   Murmur heard. Pulmonary/Chest: Effort normal and breath sounds normal. No respiratory distress. She has no wheezes. She has no rales. She exhibits no tenderness.  Abdominal: Soft. Bowel sounds  are normal. She exhibits no distension and no mass. There is no tenderness. There is no rebound and no guarding.  Musculoskeletal: Normal range of motion. She exhibits no edema or tenderness.  Lymphadenopathy:    She has no cervical adenopathy.  Neurological: She is alert and oriented to person, place, and time. She has normal reflexes. No cranial nerve deficit. She exhibits normal muscle tone. Coordination normal.  Skin: Skin  is warm and dry. No rash noted. She is not diaphoretic. No erythema. No pallor.  Psychiatric: She has a normal mood and affect. Her behavior is normal. Judgment and thought content normal.   Assessment & Plan:   Jody Huffman was seen today for annual exam.  Diagnoses and all orders for this visit:  Routine general medical examination at a health care facility  Screening for breast cancer Orders: -     MM Digital Screening; Future  Screening for cervical cancer Orders: -     Cytology - PAP  Breast cancer screening  Colon cancer screening   I am having Ms. Anastas maintain her Calcium Carbonate-Vitamin D (CALCIUM-VITAMIN D3 PO), Chromium-Cinnamon (CINNAMON PLUS CHROMIUM PO), OMEGA-3 KRILL OIL PO, Multiple Vitamins-Minerals (WOMENS MULTI VITAMIN & MINERAL PO), fluticasone, cetirizine, and atorvastatin.  No orders of the defined types were placed in this encounter.    Follow-up: Return in about 1 year (around 10/17/2015) for CPE.

## 2014-10-17 NOTE — Progress Notes (Signed)
Pre visit review using our clinic review tool, if applicable. No additional management support is needed unless otherwise documented below in the visit note. 

## 2014-10-17 NOTE — Patient Instructions (Addendum)
We will contact you about your cologuard screening and your mammogram  Health Maintenance Adopting a healthy lifestyle and getting preventive care can go a long way to promote health and wellness. Talk with your health care provider about what schedule of regular examinations is right for you. This is a good chance for you to check in with your provider about disease prevention and staying healthy. In between checkups, there are plenty of things you can do on your own. Experts have done a lot of research about which lifestyle changes and preventive measures are most likely to keep you healthy. Ask your health care provider for more information. WEIGHT AND DIET  Eat a healthy diet  Be sure to include plenty of vegetables, fruits, low-fat dairy products, and lean protein.  Do not eat a lot of foods high in solid fats, added sugars, or salt.  Get regular exercise. This is one of the most important things you can do for your health.  Most adults should exercise for at least 150 minutes each week. The exercise should increase your heart rate and make you sweat (moderate-intensity exercise).  Most adults should also do strengthening exercises at least twice a week. This is in addition to the moderate-intensity exercise.  Maintain a healthy weight  Body mass index (BMI) is a measurement that can be used to identify possible weight problems. It estimates body fat based on height and weight. Your health care provider can help determine your BMI and help you achieve or maintain a healthy weight.  For females 65 years of age and older:   A BMI below 18.5 is considered underweight.  A BMI of 18.5 to 24.9 is normal.  A BMI of 25 to 29.9 is considered overweight.  A BMI of 30 and above is considered obese.  Watch levels of cholesterol and blood lipids  You should start having your blood tested for lipids and cholesterol at 62 years of age, then have this test every 5 years.  You may need to  have your cholesterol levels checked more often if:  Your lipid or cholesterol levels are high.  You are older than 62 years of age.  You are at high risk for heart disease.  CANCER SCREENING   Lung Cancer  Lung cancer screening is recommended for adults 57-36 years old who are at high risk for lung cancer because of a history of smoking.  A yearly low-dose CT scan of the lungs is recommended for people who:  Currently smoke.  Have quit within the past 15 years.  Have at least a 30-pack-year history of smoking. A pack year is smoking an average of one pack of cigarettes a day for 1 year.  Yearly screening should continue until it has been 15 years since you quit.  Yearly screening should stop if you develop a health problem that would prevent you from having lung cancer treatment.  Breast Cancer  Practice breast self-awareness. This means understanding how your breasts normally appear and feel.  It also means doing regular breast self-exams. Let your health care provider know about any changes, no matter how small.  If you are in your 20s or 30s, you should have a clinical breast exam (CBE) by a health care provider every 1-3 years as part of a regular health exam.  If you are 33 or older, have a CBE every year. Also consider having a breast X-ray (mammogram) every year.  If you have a family history of breast cancer, talk  to your health care provider about genetic screening.  If you are at high risk for breast cancer, talk to your health care provider about having an MRI and a mammogram every year.  Breast cancer gene (BRCA) assessment is recommended for women who have family members with BRCA-related cancers. BRCA-related cancers include:  Breast.  Ovarian.  Tubal.  Peritoneal cancers.  Results of the assessment will determine the need for genetic counseling and BRCA1 and BRCA2 testing. Cervical Cancer Routine pelvic examinations to screen for cervical cancer  are no longer recommended for nonpregnant women who are considered low risk for cancer of the pelvic organs (ovaries, uterus, and vagina) and who do not have symptoms. A pelvic examination may be necessary if you have symptoms including those associated with pelvic infections. Ask your health care provider if a screening pelvic exam is right for you.   The Pap test is the screening test for cervical cancer for women who are considered at risk.  If you had a hysterectomy for a problem that was not cancer or a condition that could lead to cancer, then you no longer need Pap tests.  If you are older than 65 years, and you have had normal Pap tests for the past 10 years, you no longer need to have Pap tests.  If you have had past treatment for cervical cancer or a condition that could lead to cancer, you need Pap tests and screening for cancer for at least 20 years after your treatment.  If you no longer get a Pap test, assess your risk factors if they change (such as having a new sexual partner). This can affect whether you should start being screened again.  Some women have medical problems that increase their chance of getting cervical cancer. If this is the case for you, your health care provider may recommend more frequent screening and Pap tests.  The human papillomavirus (HPV) test is another test that may be used for cervical cancer screening. The HPV test looks for the virus that can cause cell changes in the cervix. The cells collected during the Pap test can be tested for HPV.  The HPV test can be used to screen women 72 years of age and older. Getting tested for HPV can extend the interval between normal Pap tests from three to five years.  An HPV test also should be used to screen women of any age who have unclear Pap test results.  After 62 years of age, women should have HPV testing as often as Pap tests.  Colorectal Cancer  This type of cancer can be detected and often  prevented.  Routine colorectal cancer screening usually begins at 62 years of age and continues through 62 years of age.  Your health care provider may recommend screening at an earlier age if you have risk factors for colon cancer.  Your health care provider may also recommend using home test kits to check for hidden blood in the stool.  A small camera at the end of a tube can be used to examine your colon directly (sigmoidoscopy or colonoscopy). This is done to check for the earliest forms of colorectal cancer.  Routine screening usually begins at age 15.  Direct examination of the colon should be repeated every 5-10 years through 62 years of age. However, you may need to be screened more often if early forms of precancerous polyps or small growths are found. Skin Cancer  Check your skin from head to toe regularly.  Tell your health care provider about any new moles or changes in moles, especially if there is a change in a mole's shape or color.  Also tell your health care provider if you have a mole that is larger than the size of a pencil eraser.  Always use sunscreen. Apply sunscreen liberally and repeatedly throughout the day.  Protect yourself by wearing long sleeves, pants, a wide-brimmed hat, and sunglasses whenever you are outside. HEART DISEASE, DIABETES, AND HIGH BLOOD PRESSURE   Have your blood pressure checked at least every 1-2 years. High blood pressure causes heart disease and increases the risk of stroke.  If you are between 72 years and 44 years old, ask your health care provider if you should take aspirin to prevent strokes.  Have regular diabetes screenings. This involves taking a blood sample to check your fasting blood sugar level.  If you are at a normal weight and have a low risk for diabetes, have this test once every three years after 62 years of age.  If you are overweight and have a high risk for diabetes, consider being tested at a younger age or more  often. PREVENTING INFECTION  Hepatitis B  If you have a higher risk for hepatitis B, you should be screened for this virus. You are considered at high risk for hepatitis B if:  You were born in a country where hepatitis B is common. Ask your health care provider which countries are considered high risk.  Your parents were born in a high-risk country, and you have not been immunized against hepatitis B (hepatitis B vaccine).  You have HIV or AIDS.  You use needles to inject street drugs.  You live with someone who has hepatitis B.  You have had sex with someone who has hepatitis B.  You get hemodialysis treatment.  You take certain medicines for conditions, including cancer, organ transplantation, and autoimmune conditions. Hepatitis C  Blood testing is recommended for:  Everyone born from [birth year] through 1963.  Anyone with known risk factors for hepatitis C. Sexually transmitted infections (STIs)  You should be screened for sexually transmitted infections (STIs) including gonorrhea and chlamydia if:  You are sexually active and are younger than 62 years of age.  You are older than 62 years of age and your health care provider tells you that you are at risk for this type of infection.  Your sexual activity has changed since you were last screened and you are at an increased risk for chlamydia or gonorrhea. Ask your health care provider if you are at risk.  If you do not have HIV, but are at risk, it may be recommended that you take a prescription medicine daily to prevent HIV infection. This is called pre-exposure prophylaxis (PrEP). You are considered at risk if:  You are sexually active and do not regularly use condoms or know the HIV status of your partner(s).  You take drugs by injection.  You are sexually active with a partner who has HIV. Talk with your health care provider about whether you are at high risk of being infected with HIV. If you choose to begin PrEP, you  should first be tested for HIV. You should then be tested every 3 months for as long as you are taking PrEP.  PREGNANCY   If you are premenopausal and you may become pregnant, ask your health care provider about preconception counseling.  If you may become pregnant, take 400 to 800 micrograms (mcg) of folic acid  every day.  If you want to prevent pregnancy, talk to your health care provider about birth control (contraception). OSTEOPOROSIS AND MENOPAUSE   Osteoporosis is a disease in which the bones lose minerals and strength with aging. This can result in serious bone fractures. Your risk for osteoporosis can be identified using a bone density scan.  If you are 63 years of age or older, or if you are at risk for osteoporosis and fractures, ask your health care provider if you should be screened.  Ask your health care provider whether you should take a calcium or vitamin D supplement to lower your risk for osteoporosis.  Menopause may have certain physical symptoms and risks.  Hormone replacement therapy may reduce some of these symptoms and risks. Talk to your health care provider about whether hormone replacement therapy is right for you.  HOME CARE INSTRUCTIONS   Schedule regular health, dental, and eye exams.  Stay current with your immunizations.   Do not use any tobacco products including cigarettes, chewing tobacco, or electronic cigarettes.  If you are pregnant, do not drink alcohol.  If you are breastfeeding, limit how much and how often you drink alcohol.  Limit alcohol intake to no more than 1 drink per day for nonpregnant women. One drink equals 12 ounces of beer, 5 ounces of wine, or 1 ounces of hard liquor.  Do not use street drugs.  Do not share needles.  Ask your health care provider for help if you need support or information about quitting drugs.  Tell your health care provider if you often feel depressed.  Tell your health care provider if you have ever  been abused or do not feel safe at home. Document Released: 09/13/2010 Document Revised: 07/15/2013 Document Reviewed: 01/30/2013 [facility] Patient Information 2015 [place], [place]. This information is not intended to replace advice given to you by your health care provider. Make sure you discuss any questions you have with your health care provider.

## 2014-10-17 NOTE — Assessment & Plan Note (Signed)
Discussed acute and chronic issues. Reviewed health maintenance measures, PFSHx, and immunizations. Obtained and discussed routine labs TSH, Lipid panel, CBC w/ diff, A1c, and CMET.   Clinical breast exam and PAP performed today.

## 2014-10-20 NOTE — Assessment & Plan Note (Signed)
Placed order for mammogram

## 2014-10-20 NOTE — Assessment & Plan Note (Signed)
Cologuard sheet filled out.

## 2014-10-21 LAB — CYTOLOGY - PAP

## 2014-11-13 ENCOUNTER — Encounter: Payer: Self-pay | Admitting: Nurse Practitioner

## 2014-11-13 ENCOUNTER — Other Ambulatory Visit: Payer: Self-pay | Admitting: Nurse Practitioner

## 2014-11-13 DIAGNOSIS — Z1239 Encounter for other screening for malignant neoplasm of breast: Secondary | ICD-10-CM

## 2014-11-24 ENCOUNTER — Other Ambulatory Visit: Payer: 59

## 2014-11-24 ENCOUNTER — Ambulatory Visit: Payer: 59

## 2014-12-03 ENCOUNTER — Other Ambulatory Visit: Payer: 59

## 2014-12-03 ENCOUNTER — Ambulatory Visit: Payer: 59

## 2014-12-04 ENCOUNTER — Ambulatory Visit
Admission: RE | Admit: 2014-12-04 | Discharge: 2014-12-04 | Disposition: A | Discharge status: Home / Self Care | Payer: 59 | Source: Ambulatory Visit | Attending: Nurse Practitioner | Admitting: Nurse Practitioner

## 2014-12-04 ENCOUNTER — Other Ambulatory Visit: Payer: Self-pay | Admitting: Nurse Practitioner

## 2014-12-04 DIAGNOSIS — R921 Mammographic calcification found on diagnostic imaging of breast: Secondary | ICD-10-CM | POA: Diagnosis not present

## 2014-12-04 DIAGNOSIS — Z1239 Encounter for other screening for malignant neoplasm of breast: Secondary | ICD-10-CM

## 2014-12-04 DIAGNOSIS — Z853 Personal history of malignant neoplasm of breast: Secondary | ICD-10-CM | POA: Insufficient documentation

## 2014-12-09 ENCOUNTER — Other Ambulatory Visit: Payer: Self-pay | Admitting: Nurse Practitioner

## 2014-12-09 DIAGNOSIS — R921 Mammographic calcification found on diagnostic imaging of breast: Secondary | ICD-10-CM

## 2014-12-11 ENCOUNTER — Other Ambulatory Visit: Payer: Self-pay | Admitting: Nurse Practitioner

## 2015-01-01 ENCOUNTER — Ambulatory Visit
Admission: RE | Admit: 2015-01-01 | Discharge: 2015-01-01 | Disposition: A | Discharge status: Home / Self Care | Payer: 59 | Source: Ambulatory Visit | Attending: Nurse Practitioner | Admitting: Nurse Practitioner

## 2015-01-01 ENCOUNTER — Ambulatory Visit: Admission: RE | Admit: 2015-01-01 | Payer: 59 | Source: Ambulatory Visit

## 2015-01-01 ENCOUNTER — Ambulatory Visit: Payer: 59

## 2015-01-01 DIAGNOSIS — R921 Mammographic calcification found on diagnostic imaging of breast: Secondary | ICD-10-CM

## 2015-01-02 LAB — SURGICAL PATHOLOGY

## 2015-03-14 ENCOUNTER — Other Ambulatory Visit: Payer: Self-pay | Admitting: Nurse Practitioner

## 2015-03-19 NOTE — Addendum Note (Signed)
Encounter addended by: Micheal LikensKim R Dodson on: 03/19/2015  1:37 PM<BR>     Documentation filed: Charges VN

## 2015-06-25 ENCOUNTER — Other Ambulatory Visit: Payer: Self-pay | Admitting: Nurse Practitioner

## 2015-06-25 NOTE — Telephone Encounter (Signed)
Rx refill sent to pharmacy. 

## 2016-01-14 ENCOUNTER — Other Ambulatory Visit: Payer: Self-pay | Admitting: Nurse Practitioner

## 2016-01-14 NOTE — Telephone Encounter (Signed)
Last filled 12/09/15. Has not been seen since 10/17/14. Last OV with Naomie Deanarrie Doss. Has no appt scheduled with another provider. Last lipid panel done 10/08/14.

## 2016-02-18 ENCOUNTER — Other Ambulatory Visit: Payer: Self-pay | Admitting: Family Medicine

## 2016-03-12 ENCOUNTER — Other Ambulatory Visit: Payer: Self-pay | Admitting: Family Medicine

## 2016-12-07 NOTE — Telephone Encounter (Signed)
error 

## 2017-08-11 ENCOUNTER — Other Ambulatory Visit: Payer: Self-pay | Admitting: Nurse Practitioner

## 2017-08-11 DIAGNOSIS — Z1231 Encounter for screening mammogram for malignant neoplasm of breast: Secondary | ICD-10-CM

## 2017-08-11 DIAGNOSIS — R921 Mammographic calcification found on diagnostic imaging of breast: Secondary | ICD-10-CM

## 2017-08-24 ENCOUNTER — Ambulatory Visit
Admission: RE | Admit: 2017-08-24 | Discharge: 2017-08-24 | Disposition: A | Discharge status: Home / Self Care | Payer: Medicare HMO | Source: Ambulatory Visit | Attending: Nurse Practitioner | Admitting: Nurse Practitioner

## 2017-08-24 DIAGNOSIS — R921 Mammographic calcification found on diagnostic imaging of breast: Secondary | ICD-10-CM

## 2017-08-24 DIAGNOSIS — Z1231 Encounter for screening mammogram for malignant neoplasm of breast: Secondary | ICD-10-CM | POA: Insufficient documentation

## 2018-06-07 ENCOUNTER — Other Ambulatory Visit: Payer: Self-pay | Admitting: Nurse Practitioner

## 2018-06-07 DIAGNOSIS — Z1231 Encounter for screening mammogram for malignant neoplasm of breast: Secondary | ICD-10-CM

## 2018-08-27 ENCOUNTER — Ambulatory Visit
Admission: RE | Admit: 2018-08-27 | Discharge: 2018-08-27 | Disposition: A | Discharge status: Home / Self Care | Payer: Medicare HMO | Source: Ambulatory Visit | Attending: Nurse Practitioner | Admitting: Nurse Practitioner

## 2018-08-27 ENCOUNTER — Other Ambulatory Visit: Payer: Self-pay

## 2018-08-27 DIAGNOSIS — Z1231 Encounter for screening mammogram for malignant neoplasm of breast: Secondary | ICD-10-CM | POA: Insufficient documentation

## 2018-11-22 ENCOUNTER — Other Ambulatory Visit: Payer: Self-pay | Admitting: *Deleted

## 2018-11-22 DIAGNOSIS — Z20822 Contact with and (suspected) exposure to covid-19: Secondary | ICD-10-CM

## 2018-11-23 ENCOUNTER — Telehealth: Payer: Self-pay | Admitting: General Practice

## 2018-11-23 LAB — NOVEL CORONAVIRUS, NAA: SARS-CoV-2, NAA: NOT DETECTED

## 2018-11-23 NOTE — Telephone Encounter (Signed)
Negative COVID results given. Patient results "NOT Detected." Caller expressed understanding. ° °

## 2019-08-14 ENCOUNTER — Other Ambulatory Visit: Payer: Self-pay | Admitting: Nurse Practitioner

## 2019-08-14 DIAGNOSIS — Z1231 Encounter for screening mammogram for malignant neoplasm of breast: Secondary | ICD-10-CM

## 2019-08-29 ENCOUNTER — Ambulatory Visit
Admission: RE | Admit: 2019-08-29 | Discharge: 2019-08-29 | Disposition: A | Discharge status: Home / Self Care | Payer: Medicare HMO | Source: Ambulatory Visit | Attending: Nurse Practitioner | Admitting: Nurse Practitioner

## 2019-08-29 DIAGNOSIS — Z1231 Encounter for screening mammogram for malignant neoplasm of breast: Secondary | ICD-10-CM | POA: Insufficient documentation

## 2020-10-14 ENCOUNTER — Other Ambulatory Visit: Payer: Self-pay | Admitting: Nurse Practitioner

## 2020-10-14 DIAGNOSIS — Z1231 Encounter for screening mammogram for malignant neoplasm of breast: Secondary | ICD-10-CM

## 2020-10-23 ENCOUNTER — Ambulatory Visit
Admission: RE | Admit: 2020-10-23 | Discharge: 2020-10-23 | Disposition: A | Discharge status: Home / Self Care | Payer: Medicare HMO | Source: Ambulatory Visit | Attending: Nurse Practitioner | Admitting: Nurse Practitioner

## 2020-10-23 ENCOUNTER — Other Ambulatory Visit: Payer: Self-pay

## 2020-10-23 DIAGNOSIS — Z1231 Encounter for screening mammogram for malignant neoplasm of breast: Secondary | ICD-10-CM | POA: Insufficient documentation

## 2020-12-02 DIAGNOSIS — R7303 Prediabetes: Secondary | ICD-10-CM | POA: Diagnosis not present

## 2020-12-02 DIAGNOSIS — E785 Hyperlipidemia, unspecified: Secondary | ICD-10-CM | POA: Diagnosis not present

## 2020-12-02 DIAGNOSIS — I10 Essential (primary) hypertension: Secondary | ICD-10-CM | POA: Diagnosis not present

## 2020-12-08 DIAGNOSIS — Z23 Encounter for immunization: Secondary | ICD-10-CM | POA: Diagnosis not present

## 2020-12-08 DIAGNOSIS — E1165 Type 2 diabetes mellitus with hyperglycemia: Secondary | ICD-10-CM | POA: Diagnosis not present

## 2020-12-08 DIAGNOSIS — E119 Type 2 diabetes mellitus without complications: Secondary | ICD-10-CM | POA: Diagnosis not present

## 2020-12-08 DIAGNOSIS — I10 Essential (primary) hypertension: Secondary | ICD-10-CM | POA: Diagnosis not present

## 2020-12-08 DIAGNOSIS — K219 Gastro-esophageal reflux disease without esophagitis: Secondary | ICD-10-CM | POA: Diagnosis not present

## 2020-12-08 DIAGNOSIS — E785 Hyperlipidemia, unspecified: Secondary | ICD-10-CM | POA: Diagnosis not present

## 2020-12-10 DIAGNOSIS — E119 Type 2 diabetes mellitus without complications: Secondary | ICD-10-CM | POA: Diagnosis not present

## 2020-12-22 DIAGNOSIS — I10 Essential (primary) hypertension: Secondary | ICD-10-CM | POA: Diagnosis not present

## 2021-01-06 DIAGNOSIS — I10 Essential (primary) hypertension: Secondary | ICD-10-CM | POA: Diagnosis not present

## 2021-03-18 DIAGNOSIS — E119 Type 2 diabetes mellitus without complications: Secondary | ICD-10-CM | POA: Diagnosis not present

## 2021-03-18 DIAGNOSIS — E785 Hyperlipidemia, unspecified: Secondary | ICD-10-CM | POA: Diagnosis not present

## 2021-03-18 DIAGNOSIS — I10 Essential (primary) hypertension: Secondary | ICD-10-CM | POA: Diagnosis not present

## 2021-03-24 DIAGNOSIS — E1165 Type 2 diabetes mellitus with hyperglycemia: Secondary | ICD-10-CM | POA: Diagnosis not present

## 2021-03-24 DIAGNOSIS — K219 Gastro-esophageal reflux disease without esophagitis: Secondary | ICD-10-CM | POA: Diagnosis not present

## 2021-03-24 DIAGNOSIS — I10 Essential (primary) hypertension: Secondary | ICD-10-CM | POA: Diagnosis not present

## 2021-03-24 DIAGNOSIS — E785 Hyperlipidemia, unspecified: Secondary | ICD-10-CM | POA: Diagnosis not present

## 2021-08-17 DIAGNOSIS — E785 Hyperlipidemia, unspecified: Secondary | ICD-10-CM | POA: Diagnosis not present

## 2021-08-17 DIAGNOSIS — I10 Essential (primary) hypertension: Secondary | ICD-10-CM | POA: Diagnosis not present

## 2021-08-17 DIAGNOSIS — E559 Vitamin D deficiency, unspecified: Secondary | ICD-10-CM | POA: Diagnosis not present

## 2021-08-17 DIAGNOSIS — E1165 Type 2 diabetes mellitus with hyperglycemia: Secondary | ICD-10-CM | POA: Diagnosis not present

## 2021-08-17 DIAGNOSIS — R7303 Prediabetes: Secondary | ICD-10-CM | POA: Diagnosis not present

## 2021-08-31 DIAGNOSIS — E785 Hyperlipidemia, unspecified: Secondary | ICD-10-CM | POA: Diagnosis not present

## 2021-08-31 DIAGNOSIS — J309 Allergic rhinitis, unspecified: Secondary | ICD-10-CM | POA: Diagnosis not present

## 2021-08-31 DIAGNOSIS — R7303 Prediabetes: Secondary | ICD-10-CM | POA: Diagnosis not present

## 2021-08-31 DIAGNOSIS — I10 Essential (primary) hypertension: Secondary | ICD-10-CM | POA: Diagnosis not present

## 2021-11-16 ENCOUNTER — Other Ambulatory Visit: Payer: Self-pay | Admitting: Nurse Practitioner

## 2021-11-16 DIAGNOSIS — Z1231 Encounter for screening mammogram for malignant neoplasm of breast: Secondary | ICD-10-CM

## 2021-11-19 ENCOUNTER — Ambulatory Visit
Admission: RE | Admit: 2021-11-19 | Discharge: 2021-11-19 | Disposition: A | Discharge status: Home / Self Care | Payer: Medicare HMO | Source: Ambulatory Visit | Attending: Nurse Practitioner | Admitting: Nurse Practitioner

## 2021-11-19 DIAGNOSIS — Z1231 Encounter for screening mammogram for malignant neoplasm of breast: Secondary | ICD-10-CM | POA: Diagnosis not present

## 2021-12-16 DIAGNOSIS — E119 Type 2 diabetes mellitus without complications: Secondary | ICD-10-CM | POA: Diagnosis not present

## 2021-12-27 DIAGNOSIS — E785 Hyperlipidemia, unspecified: Secondary | ICD-10-CM | POA: Diagnosis not present

## 2021-12-27 DIAGNOSIS — I10 Essential (primary) hypertension: Secondary | ICD-10-CM | POA: Diagnosis not present

## 2021-12-27 DIAGNOSIS — R7303 Prediabetes: Secondary | ICD-10-CM | POA: Diagnosis not present

## 2022-01-05 DIAGNOSIS — Z0001 Encounter for general adult medical examination with abnormal findings: Secondary | ICD-10-CM | POA: Diagnosis not present

## 2022-01-05 DIAGNOSIS — E1165 Type 2 diabetes mellitus with hyperglycemia: Secondary | ICD-10-CM | POA: Diagnosis not present

## 2022-01-05 DIAGNOSIS — E119 Type 2 diabetes mellitus without complications: Secondary | ICD-10-CM | POA: Diagnosis not present

## 2022-01-05 DIAGNOSIS — I10 Essential (primary) hypertension: Secondary | ICD-10-CM | POA: Diagnosis not present

## 2022-01-05 DIAGNOSIS — E785 Hyperlipidemia, unspecified: Secondary | ICD-10-CM | POA: Diagnosis not present

## 2022-01-05 DIAGNOSIS — E782 Mixed hyperlipidemia: Secondary | ICD-10-CM | POA: Diagnosis not present

## 2022-01-05 DIAGNOSIS — Z23 Encounter for immunization: Secondary | ICD-10-CM | POA: Diagnosis not present

## 2022-05-04 ENCOUNTER — Other Ambulatory Visit: Payer: Self-pay | Admitting: Nurse Practitioner

## 2022-05-04 ENCOUNTER — Other Ambulatory Visit: Payer: Medicare HMO

## 2022-05-04 DIAGNOSIS — E785 Hyperlipidemia, unspecified: Secondary | ICD-10-CM | POA: Diagnosis not present

## 2022-05-04 DIAGNOSIS — E1165 Type 2 diabetes mellitus with hyperglycemia: Secondary | ICD-10-CM | POA: Diagnosis not present

## 2022-05-04 DIAGNOSIS — I10 Essential (primary) hypertension: Secondary | ICD-10-CM | POA: Diagnosis not present

## 2022-05-05 LAB — LIPID PANEL W/O CHOL/HDL RATIO
Cholesterol, Total: 169 mg/dL (ref 100–199)
HDL: 64 mg/dL (ref >39)
LDL Chol Calc (NIH): 80 mg/dL (ref 0–99)
Triglycerides: 146 mg/dL (ref 0–149)
VLDL Cholesterol Cal: 25 mg/dL (ref 5–40)

## 2022-05-05 LAB — COMPREHENSIVE METABOLIC PANEL
ALT: 23 IU/L (ref 0–32)
AST: 23 IU/L (ref 0–40)
Albumin/Globulin Ratio: 1.7 (ref 1.2–2.2)
Albumin: 4.6 g/dL (ref 3.9–4.9)
Alkaline Phosphatase: 109 IU/L (ref 44–121)
BUN/Creatinine Ratio: 15 (ref 12–28)
BUN: 14 mg/dL (ref 8–27)
Bilirubin Total: 0.8 mg/dL (ref 0.0–1.2)
CO2: 24 mmol/L (ref 20–29)
Calcium: 9.4 mg/dL (ref 8.7–10.3)
Chloride: 102 mmol/L (ref 96–106)
Creatinine, Ser: 0.91 mg/dL (ref 0.57–1.00)
Globulin, Total: 2.7 g/dL (ref 1.5–4.5)
Glucose: 122 mg/dL — ABNORMAL HIGH (ref 70–99)
Potassium: 4.2 mmol/L (ref 3.5–5.2)
Sodium: 143 mmol/L (ref 134–144)
Total Protein: 7.3 g/dL (ref 6.0–8.5)
eGFR: 68 mL/min/{1.73_m2} (ref >59)

## 2022-05-05 LAB — HGB A1C W/O EAG: Hgb A1c MFr Bld: 6.4 % — ABNORMAL HIGH (ref 4.8–5.6)

## 2022-05-05 LAB — TSH: TSH: 0.971 u[IU]/mL (ref 0.450–4.500)

## 2022-05-09 ENCOUNTER — Ambulatory Visit (INDEPENDENT_AMBULATORY_CARE_PROVIDER_SITE_OTHER): Payer: Medicare HMO | Admitting: Nurse Practitioner

## 2022-05-09 ENCOUNTER — Ambulatory Visit (INDEPENDENT_AMBULATORY_CARE_PROVIDER_SITE_OTHER): Payer: Medicare HMO

## 2022-05-09 VITALS — BP 158/62 | HR 82 | Ht 67.0 in | Wt 207.0 lb

## 2022-05-09 DIAGNOSIS — E782 Mixed hyperlipidemia: Secondary | ICD-10-CM | POA: Diagnosis not present

## 2022-05-09 DIAGNOSIS — E559 Vitamin D deficiency, unspecified: Secondary | ICD-10-CM

## 2022-05-09 DIAGNOSIS — B353 Tinea pedis: Secondary | ICD-10-CM | POA: Diagnosis not present

## 2022-05-09 DIAGNOSIS — I10 Essential (primary) hypertension: Secondary | ICD-10-CM | POA: Diagnosis not present

## 2022-05-09 DIAGNOSIS — R7303 Prediabetes: Secondary | ICD-10-CM | POA: Insufficient documentation

## 2022-05-09 DIAGNOSIS — M25561 Pain in right knee: Secondary | ICD-10-CM

## 2022-05-09 HISTORY — DX: Prediabetes: R73.03

## 2022-05-09 MED ORDER — CLOTRIMAZOLE 1 % EX CREA: 1.0000 | TOPICAL_CREAM | Freq: Two times a day (BID) | CUTANEOUS | 2 refills | Status: DC

## 2022-05-09 MED ORDER — VITAMIN D (ERGOCALCIFEROL) 1.25 MG (50000 UNIT) PO CAPS: 50000.0000 [IU] | ORAL_CAPSULE | ORAL | 3 refills | Status: DC

## 2022-05-09 NOTE — Patient Instructions (Signed)
1) Right knee xray 2) Pt request pneumonia vaccine if it is time 3) Followup appt in 4 months, fasting labs prior

## 2022-05-09 NOTE — Progress Notes (Signed)
Established Patient Office Visit  Subjective:  Patient ID: Jody Huffman, female    DOB: Apr 30, 1950  Age: 72 y.o. MRN: [id number]  Chief Complaint  Patient presents with   Follow-up    4 Month Follow Up    4 month follow up and review of recent fasting labs.  Patient c/o right knee pain, x 1 week, patient has had left knee pain in past.  Agree to do xray.  A1c is at 6.4%.  LDL is at 80 mg/dl.       Past Medical History:  Diagnosis Date   Chicken pox    Heart murmur    High cholesterol    UTI (lower urinary tract infection)     Social History   Socioeconomic History   Marital status: Divorced    Spouse name: Not on file   Number of children: Not on file   Years of education: Not on file   Highest education level: Not on file  Occupational History   Not on file  Tobacco Use   Smoking status: Never   Smokeless tobacco: Never  Substance and Sexual Activity   Alcohol use: No    Alcohol/week: 0.0 standard drinks of alcohol   Drug use: No   Sexual activity: Not on file  Other Topics Concern   Not on file  Social History Narrative   Not on file   Social Determinants of Health   Financial Resource Strain: Not on file  Food Insecurity: Not on file  Transportation Needs: Not on file  Physical Activity: Not on file  Stress: Not on file  Social Connections: Not on file  Intimate Partner Violence: Not on file    Family History  Problem Relation Age of Onset   Diabetes Mother    Kidney disease Mother 86       dialysis   Cancer Father 17       pancreatic cancer   Kidney disease Sister 54       dialysis   Diabetes Brother    Breast cancer Neg Hx     Allergies  Allergen Reactions   Bee Pollen     Review of Systems  Constitutional: Negative.   HENT: Negative.    Eyes: Negative.   Respiratory: Negative.    Cardiovascular: Negative.   Gastrointestinal:  Positive for heartburn.  Genitourinary: Negative.   Musculoskeletal:  Positive for joint pain.  Skin:  Negative.   Neurological: Negative.   Endo/Heme/Allergies: Negative.   Psychiatric/Behavioral: Negative.    All other systems reviewed and are negative.      Objective:   BP (!) 158/62   Pulse 82   Ht '5\' 7"'$  (1.702 m)   Wt 207 lb (93.9 kg)   SpO2 95%   BMI 32.42 kg/m   Vitals:   05/09/22 1021  BP: (!) 158/62  Pulse: 82  Height: '5\' 7"'$  (1.702 m)  Weight: 207 lb (93.9 kg)  SpO2: 95%  BMI (Calculated): 32.41    Physical Exam Vitals reviewed.  Constitutional:      Appearance: Normal appearance.  HENT:     Head: Normocephalic.     Nose: Nose normal.     Mouth/Throat:     Mouth: Mucous membranes are moist.  Eyes:     Pupils: Pupils are equal, round, and reactive to light.  Cardiovascular:     Rate and Rhythm: Normal rate and regular rhythm.  Pulmonary:     Effort: Pulmonary effort is normal.  Breath sounds: Normal breath sounds.  Abdominal:     General: Bowel sounds are normal.     Palpations: Abdomen is soft.  Musculoskeletal:        General: Tenderness present.     Cervical back: Normal range of motion and neck supple.  Skin:    General: Skin is warm and dry.  Neurological:     Mental Status: She is alert and oriented to person, place, and time.  Psychiatric:        Mood and Affect: Mood normal.        Behavior: Behavior normal.      No results found for any visits on 05/09/22.  Recent Results (from the past 2160 hour(s))  Comprehensive metabolic panel     Status: Abnormal   Collection Time: 05/04/22 11:10 AM  Result Value Ref Range   Glucose 122 (H) 70 - 99 mg/dL   BUN 14 8 - 27 mg/dL   Creatinine, Ser 0.91 0.57 - 1.00 mg/dL   eGFR 68 >59 mL/min/1.73   BUN/Creatinine Ratio 15 12 - 28   Sodium 143 134 - 144 mmol/L   Potassium 4.2 3.5 - 5.2 mmol/L   Chloride 102 96 - 106 mmol/L   CO2 24 20 - 29 mmol/L   Calcium 9.4 8.7 - 10.3 mg/dL   Total Protein 7.3 6.0 - 8.5 g/dL   Albumin 4.6 3.9 - 4.9 g/dL   Globulin, Total 2.7 1.5 - 4.5 g/dL    Albumin/Globulin Ratio 1.7 1.2 - 2.2   Bilirubin Total 0.8 0.0 - 1.2 mg/dL   Alkaline Phosphatase 109 44 - 121 IU/L   AST 23 0 - 40 IU/L   ALT 23 0 - 32 IU/L  Lipid Panel w/o Chol/HDL Ratio     Status: None   Collection Time: 05/04/22 11:10 AM  Result Value Ref Range   Cholesterol, Total 169 100 - 199 mg/dL   Triglycerides 146 0 - 149 mg/dL   HDL 64 >39 mg/dL   VLDL Cholesterol Cal 25 5 - 40 mg/dL   LDL Chol Calc (NIH) 80 0 - 99 mg/dL  Hgb A1c w/o eAG     Status: Abnormal   Collection Time: 05/04/22 11:10 AM  Result Value Ref Range   Hgb A1c MFr Bld 6.4 (H) 4.8 - 5.6 %    Comment:          Prediabetes: 5.7 - 6.4          Diabetes: >6.4          Glycemic control for adults with diabetes: <7.0   TSH     Status: None   Collection Time: 05/04/22 11:10 AM  Result Value Ref Range   TSH 0.971 0.450 - 4.500 uIU/mL      Assessment & Plan:   Problem List Items Addressed This Visit       Cardiovascular and Mediastinum   Essential hypertension, benign   Relevant Medications   amLODipine (NORVASC) 5 MG tablet   losartan (COZAAR) 100 MG tablet     Other   Mixed hyperlipidemia - Primary   Relevant Medications   amLODipine (NORVASC) 5 MG tablet   losartan (COZAAR) 100 MG tablet   Prediabetes   Right knee pain    Return in about 4 months (around 09/07/2022).   Total time spent: 30 minutes  Evern Bio, NP  05/09/2022

## 2022-06-12 ENCOUNTER — Other Ambulatory Visit: Payer: Self-pay | Admitting: Nurse Practitioner

## 2022-06-12 DIAGNOSIS — I10 Essential (primary) hypertension: Secondary | ICD-10-CM

## 2022-07-01 ENCOUNTER — Other Ambulatory Visit: Payer: Self-pay | Admitting: Nurse Practitioner

## 2022-07-02 ENCOUNTER — Other Ambulatory Visit: Payer: Self-pay | Admitting: Nurse Practitioner

## 2022-07-02 DIAGNOSIS — E785 Hyperlipidemia, unspecified: Secondary | ICD-10-CM

## 2022-09-05 ENCOUNTER — Other Ambulatory Visit: Payer: Medicare HMO

## 2022-09-05 DIAGNOSIS — I10 Essential (primary) hypertension: Secondary | ICD-10-CM | POA: Diagnosis not present

## 2022-09-05 DIAGNOSIS — E782 Mixed hyperlipidemia: Secondary | ICD-10-CM

## 2022-09-05 DIAGNOSIS — R7303 Prediabetes: Secondary | ICD-10-CM | POA: Diagnosis not present

## 2022-09-06 LAB — CBC WITH DIFFERENTIAL
Basophils Absolute: 0 10*3/uL (ref 0.0–0.2)
Basos: 1 %
EOS (ABSOLUTE): 0.1 10*3/uL (ref 0.0–0.4)
Eos: 2 %
Hematocrit: 36.4 % (ref 34.0–46.6)
Hemoglobin: 12.2 g/dL (ref 11.1–15.9)
Immature Grans (Abs): 0 10*3/uL (ref 0.0–0.1)
Immature Granulocytes: 0 %
Lymphocytes Absolute: 2.6 10*3/uL (ref 0.7–3.1)
Lymphs: 46 %
MCH: 29.6 pg (ref 26.6–33.0)
MCHC: 33.5 g/dL (ref 31.5–35.7)
MCV: 88 fL (ref 79–97)
Monocytes Absolute: 0.3 10*3/uL (ref 0.1–0.9)
Monocytes: 6 %
Neutrophils Absolute: 2.5 10*3/uL (ref 1.4–7.0)
Neutrophils: 45 %
RBC: 4.12 x10E6/uL (ref 3.77–5.28)
RDW: 13.6 % (ref 11.7–15.4)
WBC: 5.5 10*3/uL (ref 3.4–10.8)

## 2022-09-06 LAB — LIPID PANEL
Chol/HDL Ratio: 3 ratio (ref 0.0–4.4)
Cholesterol, Total: 177 mg/dL (ref 100–199)
HDL: 59 mg/dL (ref >39)
LDL Chol Calc (NIH): 98 mg/dL (ref 0–99)
Triglycerides: 110 mg/dL (ref 0–149)
VLDL Cholesterol Cal: 20 mg/dL (ref 5–40)

## 2022-09-06 LAB — CMP14+EGFR
ALT: 22 IU/L (ref 0–32)
AST: 23 IU/L (ref 0–40)
Albumin: 4.4 g/dL (ref 3.9–4.9)
Alkaline Phosphatase: 116 IU/L (ref 44–121)
BUN/Creatinine Ratio: 25 (ref 12–28)
BUN: 21 mg/dL (ref 8–27)
Bilirubin Total: 0.6 mg/dL (ref 0.0–1.2)
CO2: 26 mmol/L (ref 20–29)
Calcium: 9.9 mg/dL (ref 8.7–10.3)
Chloride: 98 mmol/L (ref 96–106)
Creatinine, Ser: 0.85 mg/dL (ref 0.57–1.00)
Globulin, Total: 2.7 g/dL (ref 1.5–4.5)
Glucose: 115 mg/dL — ABNORMAL HIGH (ref 70–99)
Potassium: 4.7 mmol/L (ref 3.5–5.2)
Sodium: 137 mmol/L (ref 134–144)
Total Protein: 7.1 g/dL (ref 6.0–8.5)
eGFR: 74 mL/min/{1.73_m2} (ref >59)

## 2022-09-06 LAB — HEMOGLOBIN A1C
Est. average glucose Bld gHb Est-mCnc: 143 mg/dL
Hgb A1c MFr Bld: 6.6 % — ABNORMAL HIGH (ref 4.8–5.6)

## 2022-09-06 LAB — TSH: TSH: 1.61 u[IU]/mL (ref 0.450–4.500)

## 2022-09-08 ENCOUNTER — Ambulatory Visit: Payer: Medicare HMO | Admitting: Nurse Practitioner

## 2022-09-21 ENCOUNTER — Other Ambulatory Visit: Payer: Self-pay | Admitting: Nurse Practitioner

## 2022-09-21 DIAGNOSIS — E119 Type 2 diabetes mellitus without complications: Secondary | ICD-10-CM

## 2022-09-30 ENCOUNTER — Ambulatory Visit (INDEPENDENT_AMBULATORY_CARE_PROVIDER_SITE_OTHER): Payer: Medicare HMO | Admitting: Cardiology

## 2022-09-30 ENCOUNTER — Encounter: Payer: Self-pay | Admitting: Cardiology

## 2022-09-30 VITALS — BP 140/80 | HR 80 | Ht 67.0 in | Wt 209.4 lb

## 2022-09-30 DIAGNOSIS — Z1211 Encounter for screening for malignant neoplasm of colon: Secondary | ICD-10-CM | POA: Diagnosis not present

## 2022-09-30 DIAGNOSIS — R7303 Prediabetes: Secondary | ICD-10-CM

## 2022-09-30 DIAGNOSIS — B353 Tinea pedis: Secondary | ICD-10-CM | POA: Diagnosis not present

## 2022-09-30 DIAGNOSIS — E119 Type 2 diabetes mellitus without complications: Secondary | ICD-10-CM

## 2022-09-30 DIAGNOSIS — I10 Essential (primary) hypertension: Secondary | ICD-10-CM | POA: Diagnosis not present

## 2022-09-30 DIAGNOSIS — E782 Mixed hyperlipidemia: Secondary | ICD-10-CM

## 2022-09-30 LAB — POCT CBG (FASTING - GLUCOSE)-MANUAL ENTRY: Glucose Fasting, POC: 115 mg/dL — AB (ref 70–99)

## 2022-09-30 MED ORDER — CLOTRIMAZOLE 1 % EX CREA
1.0000 | TOPICAL_CREAM | Freq: Two times a day (BID) | CUTANEOUS | 2 refills | Status: DC
Start: 2022-09-30 — End: 2023-10-02

## 2022-09-30 NOTE — Progress Notes (Signed)
Established Patient Office Visit  Subjective:  Patient ID: Jody Huffman, female    DOB: [birth year]  Age: 72 y.o. MRN: [id number]  Chief Complaint  Patient presents with   Follow-up    4 month follow up    Patient in office for 4 month follow up, discuss recent lab work. Patient doing well. No complaints today. Recent fasting lab work. Patient states she recently went on a cruise, indulged in sweets and starchy food. Hgb A1c increased. Has been working on diet control since returning from cruise.  LDL well controlled on atorvastatin.  Sees eye doctor yearly, has an appointment for next month.  Patient due for a mammogram in September. Patient had a colonoscopy in 2021, thought she was told to have it repeated in 3 years. Will send a new referral.    No other concerns at this time.   Past Medical History:  Diagnosis Date   Chicken pox    Heart murmur    High cholesterol    UTI (lower urinary tract infection)     Past Surgical History:  Procedure Laterality Date   ABDOMINAL HYSTERECTOMY  1996   BREAST BIOPSY Right 15+ yrs ago   neg   BREAST BIOPSY Left 15+yrs ago   neg   BREAST BIOPSY Right 2016   neg   Right breast cyst removed     In her 30s    Social History   Socioeconomic History   Marital status: Divorced    Spouse name: Not on file   Number of children: Not on file   Years of education: Not on file   Highest education level: Not on file  Occupational History   Not on file  Tobacco Use   Smoking status: Never   Smokeless tobacco: Never  Substance and Sexual Activity   Alcohol use: No    Alcohol/week: 0.0 standard drinks of alcohol   Drug use: No   Sexual activity: Not on file  Other Topics Concern   Not on file  Social History Narrative   Not on file   Social Determinants of Health   Financial Resource Strain: Not on file  Food Insecurity: Not on file  Transportation Needs: Not on file  Physical Activity: Not on file  Stress: Not on file   Social Connections: Not on file  Intimate Partner Violence: Not on file    Family History  Problem Relation Age of Onset   Diabetes Mother    Kidney disease Mother 67       dialysis   Cancer Father 68       pancreatic cancer   Kidney disease Sister 68       dialysis   Diabetes Brother    Breast cancer Neg Hx     Allergies  Allergen Reactions   Bee Pollen     Review of Systems  Constitutional: Negative.   HENT: Negative.    Eyes: Negative.   Respiratory: Negative.  Negative for shortness of breath.   Cardiovascular: Negative.  Negative for chest pain.  Gastrointestinal: Negative.  Negative for abdominal pain, constipation and diarrhea.  Genitourinary: Negative.   Musculoskeletal:  Negative for joint pain and myalgias.  Skin: Negative.   Neurological: Negative.  Negative for dizziness and headaches.  Endo/Heme/Allergies: Negative.   All other systems reviewed and are negative.      Objective:   BP (!) 140/80   Pulse 80   Ht 5\' 7"  (1.702 m)   Wt 209 lb 6.4  oz (95 kg)   SpO2 99%   BMI 32.80 kg/m   Vitals:   09/30/22 1022  BP: (!) 140/80  Pulse: 80  Height: 5\' 7"  (1.702 m)  Weight: 209 lb 6.4 oz (95 kg)  SpO2: 99%  BMI (Calculated): 32.79    Physical Exam Vitals and nursing note reviewed.  Constitutional:      Appearance: Normal appearance. She is normal weight.  HENT:     Head: Normocephalic and atraumatic.     Nose: Nose normal.     Mouth/Throat:     Mouth: Mucous membranes are moist.  Eyes:     Extraocular Movements: Extraocular movements intact.     Conjunctiva/sclera: Conjunctivae normal.     Pupils: Pupils are equal, round, and reactive to light.  Cardiovascular:     Rate and Rhythm: Normal rate and regular rhythm.     Pulses: Normal pulses.     Heart sounds: Normal heart sounds.  Pulmonary:     Effort: Pulmonary effort is normal.     Breath sounds: Normal breath sounds.  Abdominal:     General: Abdomen is flat. Bowel sounds are  normal.     Palpations: Abdomen is soft.  Musculoskeletal:        General: Normal range of motion.     Cervical back: Normal range of motion.  Skin:    General: Skin is warm and dry.  Neurological:     General: No focal deficit present.     Mental Status: She is alert and oriented to person, place, and time.  Psychiatric:        Mood and Affect: Mood normal.        Behavior: Behavior normal.        Thought Content: Thought content normal.        Judgment: Judgment normal.      Results for orders placed or performed in visit on 09/30/22  POCT CBG (Fasting - Glucose)  Result Value Ref Range   Glucose Fasting, POC 115 (A) 70 - 99 mg/dL    Recent Results (from the past 2160 hour(s))  Hemoglobin A1c     Status: Abnormal   Collection Time: 09/05/22  9:20 AM  Result Value Ref Range   Hgb A1c MFr Bld 6.6 (H) 4.8 - 5.6 %    Comment:          Prediabetes: 5.7 - 6.4          Diabetes: >6.4          Glycemic control for adults with diabetes: <7.0    Est. average glucose Bld gHb Est-mCnc 143 mg/dL  TSH     Status: None   Collection Time: 09/05/22  9:20 AM  Result Value Ref Range   TSH 1.610 0.450 - 4.500 uIU/mL  CMP14+EGFR     Status: Abnormal   Collection Time: 09/05/22  9:20 AM  Result Value Ref Range   Glucose 115 (H) 70 - 99 mg/dL   BUN 21 8 - 27 mg/dL   Creatinine, Ser 1.91 0.57 - 1.00 mg/dL   eGFR 74 >47 WG/NFA/2.13   BUN/Creatinine Ratio 25 12 - 28   Sodium 137 134 - 144 mmol/L   Potassium 4.7 3.5 - 5.2 mmol/L   Chloride 98 96 - 106 mmol/L   CO2 26 20 - 29 mmol/L   Calcium 9.9 8.7 - 10.3 mg/dL   Total Protein 7.1 6.0 - 8.5 g/dL   Albumin 4.4 3.9 - 4.9 g/dL   Globulin,  Total 2.7 1.5 - 4.5 g/dL   Bilirubin Total 0.6 0.0 - 1.2 mg/dL   Alkaline Phosphatase 116 44 - 121 IU/L   AST 23 0 - 40 IU/L   ALT 22 0 - 32 IU/L  Lipid panel     Status: None   Collection Time: 09/05/22  9:20 AM  Result Value Ref Range   Cholesterol, Total 177 100 - 199 mg/dL   Triglycerides 595  0 - 149 mg/dL   HDL 59 >63 mg/dL   VLDL Cholesterol Cal 20 5 - 40 mg/dL   LDL Chol Calc (NIH) 98 0 - 99 mg/dL   Chol/HDL Ratio 3.0 0.0 - 4.4 ratio    Comment:                                   T. Chol/HDL Ratio                                             Men  Women                               1/2 Avg.Risk  3.4    3.3                                   Avg.Risk  5.0    4.4                                2X Avg.Risk  9.6    7.1                                3X Avg.Risk 23.4   11.0   CBC With Differential     Status: None   Collection Time: 09/05/22  9:20 AM  Result Value Ref Range   WBC 5.5 3.4 - 10.8 x10E3/uL   RBC 4.12 3.77 - 5.28 x10E6/uL   Hemoglobin 12.2 11.1 - 15.9 g/dL   Hematocrit 87.5 64.3 - 46.6 %   MCV 88 79 - 97 fL   MCH 29.6 26.6 - 33.0 pg   MCHC 33.5 31.5 - 35.7 g/dL   RDW 32.9 51.8 - 84.1 %   Neutrophils 45 Not Estab. %   Lymphs 46 Not Estab. %   Monocytes 6 Not Estab. %   Eos 2 Not Estab. %   Basos 1 Not Estab. %   Neutrophils Absolute 2.5 1.4 - 7.0 x10E3/uL   Lymphocytes Absolute 2.6 0.7 - 3.1 x10E3/uL   Monocytes Absolute 0.3 0.1 - 0.9 x10E3/uL   EOS (ABSOLUTE) 0.1 0.0 - 0.4 x10E3/uL   Basophils Absolute 0.0 0.0 - 0.2 x10E3/uL   Immature Granulocytes 0 Not Estab. %   Immature Grans (Abs) 0.0 0.0 - 0.1 x10E3/uL    Comment: **Effective October 10, 2022, profile [id number] CBC/Differential**   (No Platelet) will be made non-orderable. [facility] Offers:   [id number] CBC With Differential/Platelet   POCT CBG (Fasting - Glucose)     Status: Abnormal   Collection Time: 09/30/22 10:51 AM  Result Value Ref Range   Glucose  Fasting, POC 115 (A) 70 - 99 mg/dL      Assessment & Plan:  Continue all medications. Strict diet control to lower Hgb A1c. Order mammogram at next visit. Colonoscopy referral sent.  Keep eye appointment.  Problem List Items Addressed This Visit       Cardiovascular and Mediastinum   Essential hypertension, benign - Primary   Relevant Orders    TSH   CMP14+EGFR   CBC With Diff/Platelet     Endocrine   Diabetes mellitus without complication (HCC)     Other   Colon cancer screening   Relevant Orders   Ambulatory referral to Gastroenterology   Mixed hyperlipidemia   Relevant Orders   TSH   CMP14+EGFR   Lipid panel   CBC With Diff/Platelet   Prediabetes   Relevant Orders   Hemoglobin A1c   TSH   CMP14+EGFR   CBC With Diff/Platelet   POCT CBG (Fasting - Glucose) (Completed)   Other Visit Diagnoses     Tinea pedis of right foot       Relevant Medications   clotrimazole (LOTRIMIN AF) 1 % cream       No follow-ups on file.   Total time spent: 30 minutes  Max Nuno, NP  09/30/2022   This document may have been prepared by Dragon Voice Recognition software and as such may include unintentional dictation errors.

## 2022-12-27 ENCOUNTER — Encounter: Payer: Self-pay | Admitting: Nurse Practitioner

## 2022-12-27 DIAGNOSIS — Z1231 Encounter for screening mammogram for malignant neoplasm of breast: Secondary | ICD-10-CM

## 2023-01-05 IMAGING — MG MM DIGITAL SCREENING BILAT W/ TOMO AND CAD
6 of 10 series · 6 of 30 positions shown · non-contrast
Comparison: Previous exam(s).

CLINICAL DATA: Screening.

EXAM:
DIGITAL SCREENING BILATERAL MAMMOGRAM WITH TOMOSYNTHESIS AND CAD
TECHNIQUE: Bilateral screening digital craniocaudal and mediolateral oblique
mammograms were obtained. Bilateral screening digital breast
tomosynthesis was performed. The images were evaluated with
computer-aided detection.

[L CC synth-2D]
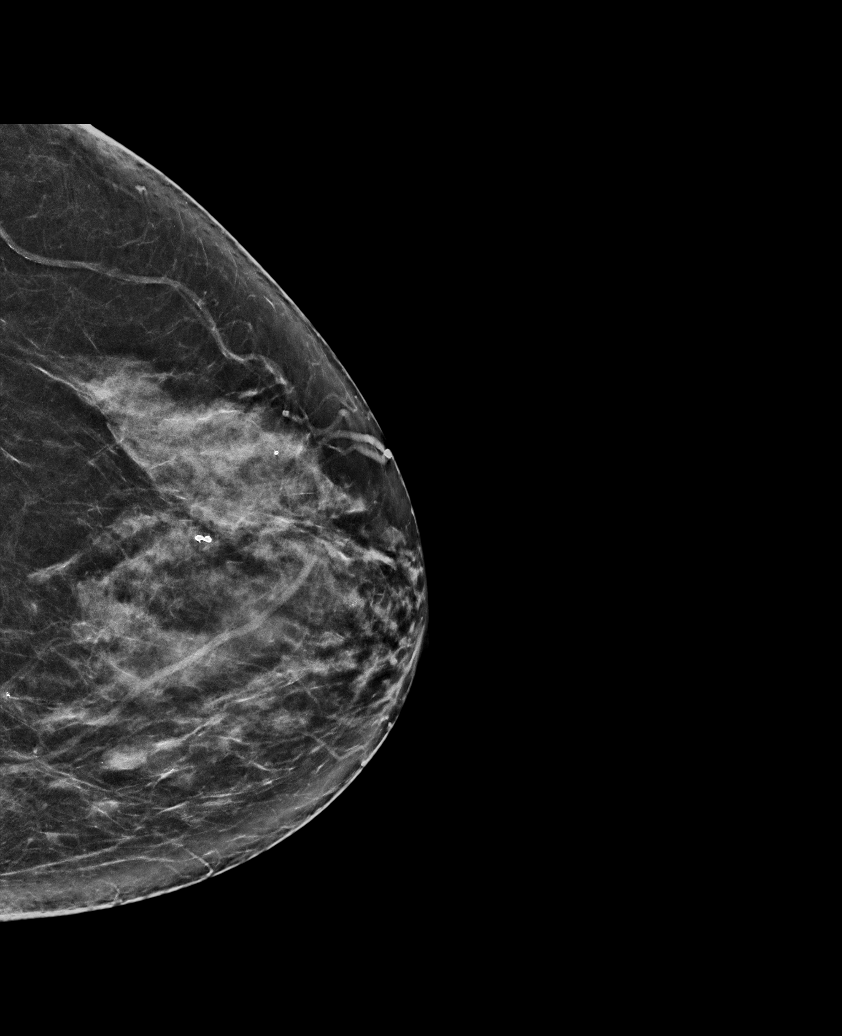

[R CC synth-2D]
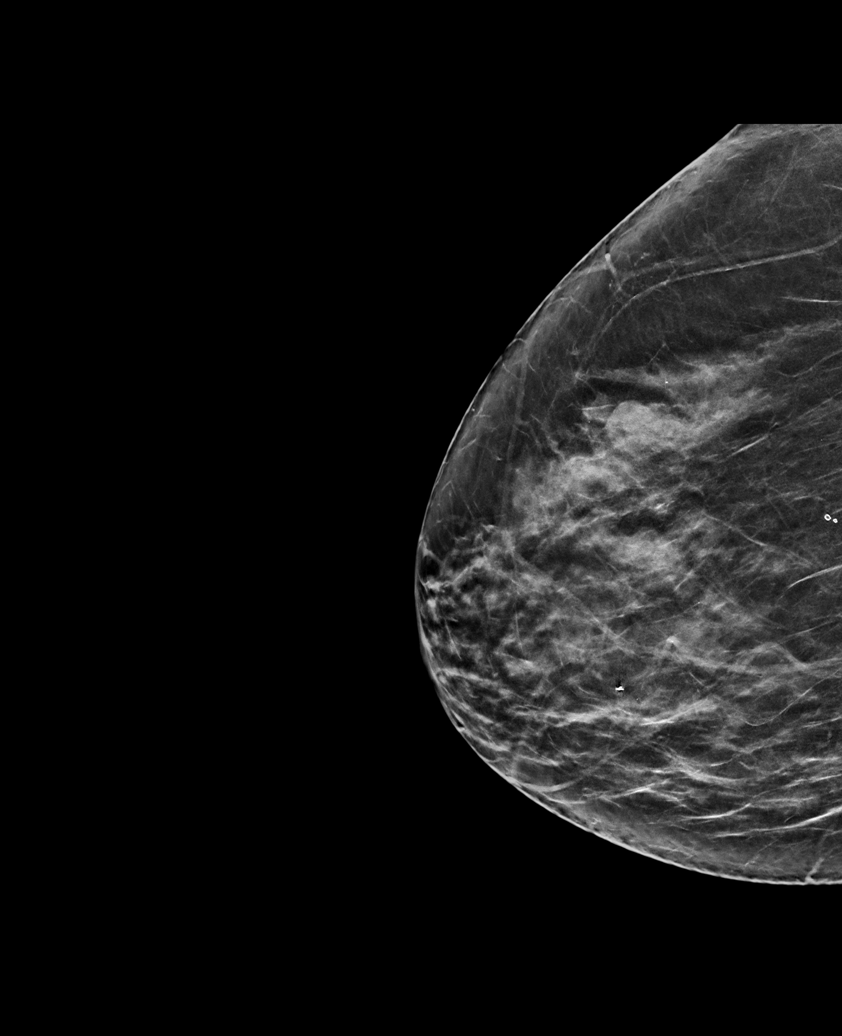

[R MLO synth-2D (1 of 2)]
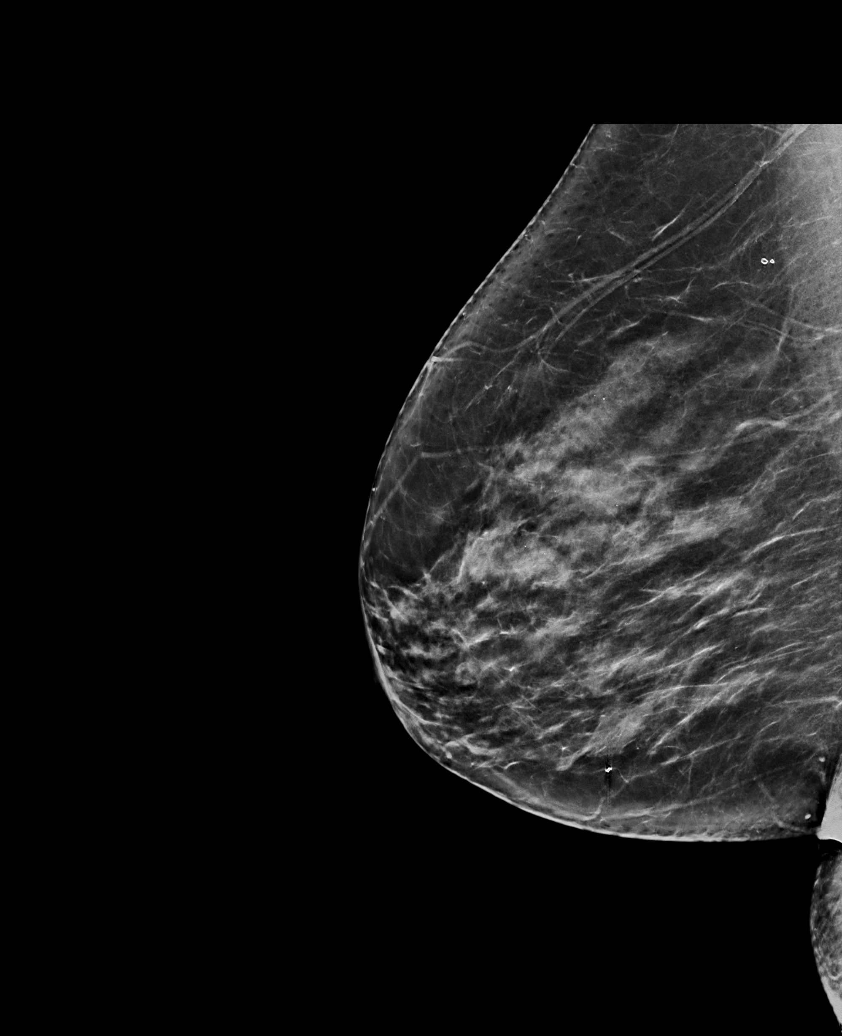

[L MLO synth-2D]
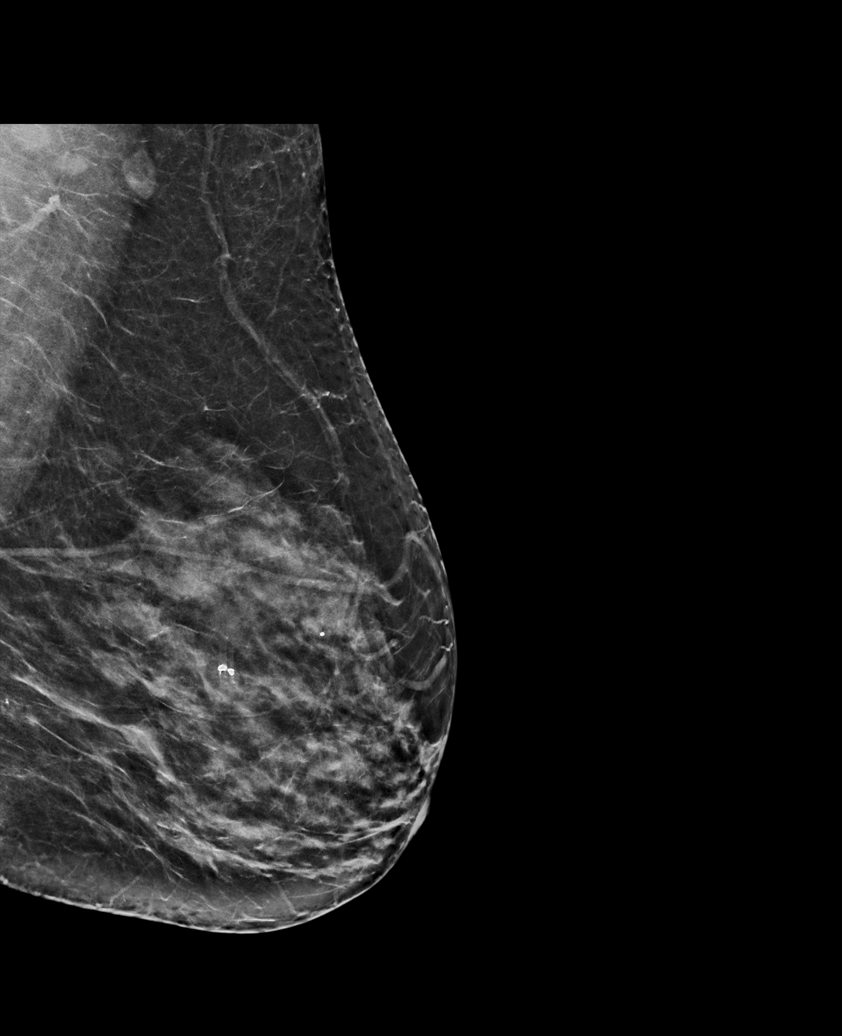

[R MLO synth-2D (2 of 2)]
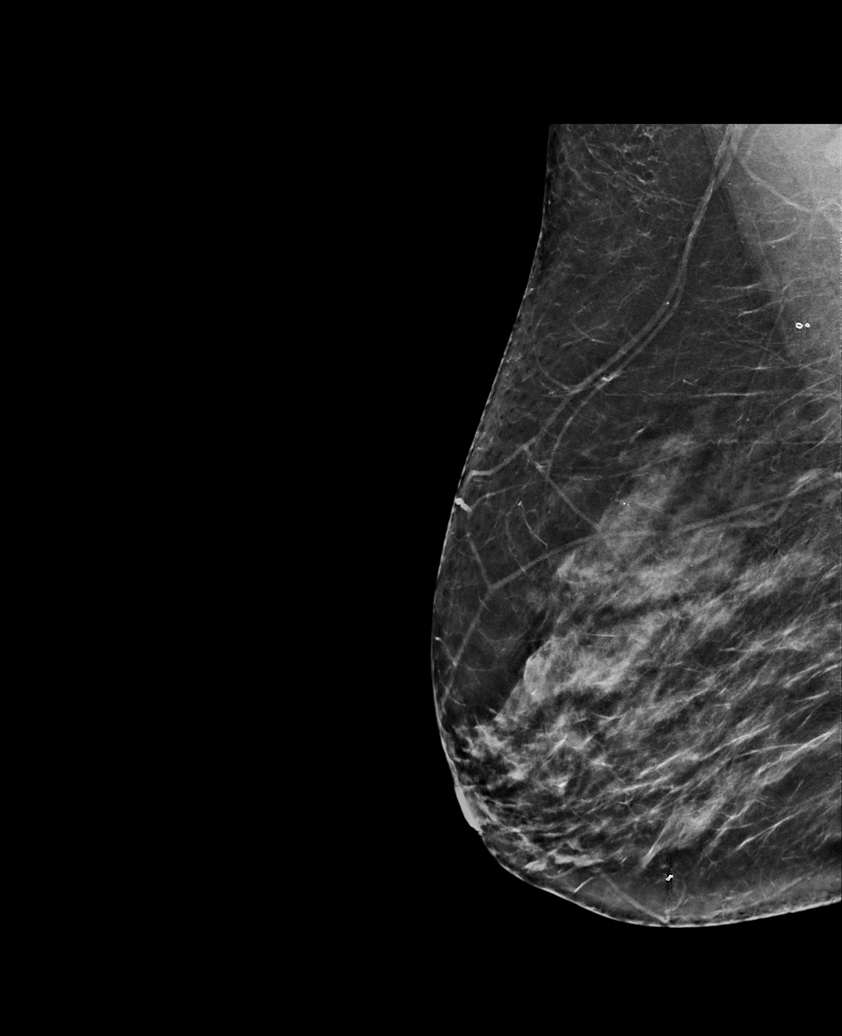

[L CC tomo · tomo slice 35/68.0]
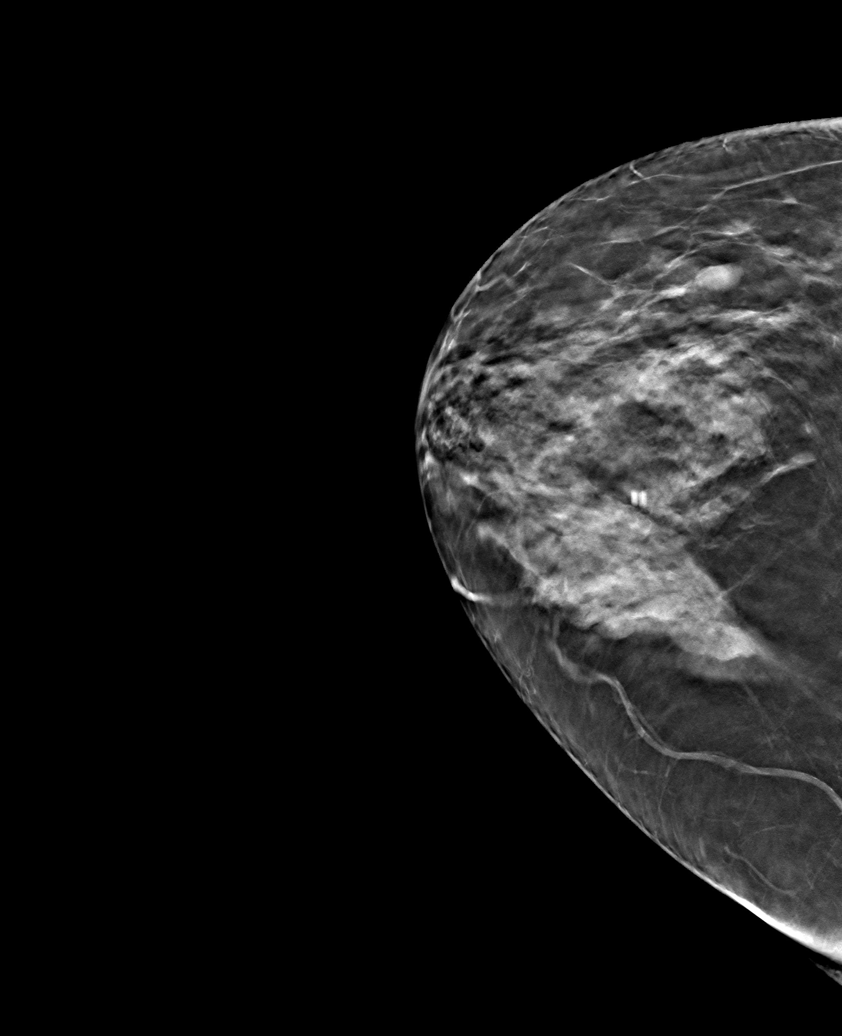

[6 of 30 positions shown; findings below may reference images not displayed]

ACR Breast Density Category c: The breast tissue is heterogeneously
dense, which may obscure small masses.
FINDINGS: There are no findings suspicious for malignancy.
IMPRESSION: No mammographic evidence of malignancy. A result letter of this
screening mammogram will be mailed directly to the patient.

RECOMMENDATION:
Screening mammogram in one year. (Code:Q3-W-BC3)

BI-RADS CATEGORY  1: Negative.

## 2023-01-27 ENCOUNTER — Telehealth: Payer: Self-pay | Admitting: Cardiology

## 2023-01-27 ENCOUNTER — Other Ambulatory Visit: Payer: Medicare HMO

## 2023-01-27 DIAGNOSIS — E782 Mixed hyperlipidemia: Secondary | ICD-10-CM

## 2023-01-27 DIAGNOSIS — R7303 Prediabetes: Secondary | ICD-10-CM | POA: Diagnosis not present

## 2023-01-27 DIAGNOSIS — I10 Essential (primary) hypertension: Secondary | ICD-10-CM | POA: Diagnosis not present

## 2023-01-27 NOTE — Telephone Encounter (Signed)
Patient stopped by when at the office and asks if we can switch from Januvia to something more cost friendly. She is in the doughnut hole right now and cannot afford Januvia and we do not get samples of that medication. What is a good alternative that is cost friendly?  [facility] [address]

## 2023-01-28 LAB — CBC WITH DIFF/PLATELET
Basophils Absolute: 0 10*3/uL (ref 0.0–0.2)
Basos: 1 %
EOS (ABSOLUTE): 0.1 10*3/uL (ref 0.0–0.4)
Eos: 1 %
Hematocrit: 37.6 % (ref 34.0–46.6)
Hemoglobin: 12.2 g/dL (ref 11.1–15.9)
Immature Grans (Abs): 0 10*3/uL (ref 0.0–0.1)
Immature Granulocytes: 0 %
Lymphocytes Absolute: 2.3 10*3/uL (ref 0.7–3.1)
Lymphs: 36 %
MCH: 29.2 pg (ref 26.6–33.0)
MCHC: 32.4 g/dL (ref 31.5–35.7)
MCV: 90 fL (ref 79–97)
Monocytes Absolute: 0.3 10*3/uL (ref 0.1–0.9)
Monocytes: 5 %
Neutrophils Absolute: 3.6 10*3/uL (ref 1.4–7.0)
Neutrophils: 57 %
Platelets: 248 10*3/uL (ref 150–450)
RBC: 4.18 x10E6/uL (ref 3.77–5.28)
RDW: 13.3 % (ref 11.7–15.4)
WBC: 6.2 10*3/uL (ref 3.4–10.8)

## 2023-01-28 LAB — LIPID PANEL
Chol/HDL Ratio: 3.3 ratio (ref 0.0–4.4)
Cholesterol, Total: 162 mg/dL (ref 100–199)
HDL: 49 mg/dL (ref >39)
LDL Chol Calc (NIH): 91 mg/dL (ref 0–99)
Triglycerides: 121 mg/dL (ref 0–149)
VLDL Cholesterol Cal: 22 mg/dL (ref 5–40)

## 2023-01-28 LAB — CMP14+EGFR
ALT: 19 [IU]/L (ref 0–32)
AST: 22 [IU]/L (ref 0–40)
Albumin: 4.3 g/dL (ref 3.9–4.9)
Alkaline Phosphatase: 106 [IU]/L (ref 44–121)
BUN/Creatinine Ratio: 18 (ref 12–28)
BUN: 16 mg/dL (ref 8–27)
Bilirubin Total: 0.8 mg/dL (ref 0.0–1.2)
CO2: 25 mmol/L (ref 20–29)
Calcium: 9.6 mg/dL (ref 8.7–10.3)
Chloride: 101 mmol/L (ref 96–106)
Creatinine, Ser: 0.87 mg/dL (ref 0.57–1.00)
Globulin, Total: 2.7 g/dL (ref 1.5–4.5)
Glucose: 115 mg/dL — ABNORMAL HIGH (ref 70–99)
Potassium: 4.3 mmol/L (ref 3.5–5.2)
Sodium: 140 mmol/L (ref 134–144)
Total Protein: 7 g/dL (ref 6.0–8.5)
eGFR: 72 mL/min/{1.73_m2} (ref >59)

## 2023-01-28 LAB — TSH: TSH: 1.41 u[IU]/mL (ref 0.450–4.500)

## 2023-01-28 LAB — HEMOGLOBIN A1C
Est. average glucose Bld gHb Est-mCnc: 140 mg/dL
Hgb A1c MFr Bld: 6.5 % — ABNORMAL HIGH (ref 4.8–5.6)

## 2023-01-31 ENCOUNTER — Ambulatory Visit (INDEPENDENT_AMBULATORY_CARE_PROVIDER_SITE_OTHER): Payer: Medicare HMO | Admitting: Cardiology

## 2023-01-31 ENCOUNTER — Encounter: Payer: Self-pay | Admitting: Cardiology

## 2023-01-31 VITALS — BP 118/78 | HR 73 | Ht 67.0 in | Wt 200.4 lb

## 2023-01-31 DIAGNOSIS — R7303 Prediabetes: Secondary | ICD-10-CM

## 2023-01-31 DIAGNOSIS — I10 Essential (primary) hypertension: Secondary | ICD-10-CM

## 2023-01-31 DIAGNOSIS — E119 Type 2 diabetes mellitus without complications: Secondary | ICD-10-CM

## 2023-01-31 DIAGNOSIS — E782 Mixed hyperlipidemia: Secondary | ICD-10-CM

## 2023-01-31 DIAGNOSIS — Z1231 Encounter for screening mammogram for malignant neoplasm of breast: Secondary | ICD-10-CM | POA: Diagnosis not present

## 2023-01-31 LAB — POCT CBG (FASTING - GLUCOSE)-MANUAL ENTRY: Glucose Fasting, POC: 134 mg/dL — AB (ref 70–99)

## 2023-01-31 NOTE — Progress Notes (Signed)
Established Patient Office Visit  Subjective:  Patient ID: Jody Huffman, female    DOB: [birth year]  Age: 72 y.o. MRN: [id number]  Chief Complaint  Patient presents with   Follow-up    Patient in office for 4 month follow up. Patient reports feeling well. No complaints today.  Discussed recent lab work, Hgb A1c improving. Patient in the donut hole, jardiance too expensive, will give samples of Farxiga 10 mg daily.  Mammogram order placed.  Scheduled for colonoscopy next week.  DEE next month.     No other concerns at this time.   Past Medical History:  Diagnosis Date   Chicken pox    Heart murmur    High cholesterol    UTI (lower urinary tract infection)     Past Surgical History:  Procedure Laterality Date   ABDOMINAL HYSTERECTOMY  1996   BREAST BIOPSY Right 15+ yrs ago   neg   BREAST BIOPSY Left 15+yrs ago   neg   BREAST BIOPSY Right 2016   neg   Right breast cyst removed     In her 30s    Social History   Socioeconomic History   Marital status: Divorced    Spouse name: Not on file   Number of children: Not on file   Years of education: Not on file   Highest education level: Not on file  Occupational History   Not on file  Tobacco Use   Smoking status: Never   Smokeless tobacco: Never  Substance and Sexual Activity   Alcohol use: No    Alcohol/week: 0.0 standard drinks of alcohol   Drug use: No   Sexual activity: Not on file  Other Topics Concern   Not on file  Social History Narrative   Not on file   Social Determinants of Health   Financial Resource Strain: Not on file  Food Insecurity: Not on file  Transportation Needs: Not on file  Physical Activity: Not on file  Stress: Not on file  Social Connections: Not on file  Intimate Partner Violence: Not on file    Family History  Problem Relation Age of Onset   Diabetes Mother    Kidney disease Mother 59       dialysis   Cancer Father 83       pancreatic cancer   Kidney disease Sister 82        dialysis   Diabetes Brother    Breast cancer Neg Hx     Allergies  Allergen Reactions   Bee Pollen     Outpatient Medications Prior to Visit  Medication Sig   FLUAD 0.5 ML injection Inject 0.5 mLs into the muscle once.   amLODipine (NORVASC) 5 MG tablet TAKE 1 TABLET BY MOUTH DAILY IN AM FOR BLOOD PRESSURE   atorvastatin (LIPITOR) 40 MG tablet TAKE 1 TABLET ORALLY NIGHTLY AT BEDTIME FOR HIGH CHOLESTEROL   Calcium Carbonate-Vitamin D (CALCIUM-VITAMIN D3 PO) Take by mouth.   cetirizine (ZYRTEC) 10 MG tablet Take 10 mg by mouth daily.   Chromium-Cinnamon (CINNAMON PLUS CHROMIUM PO) Take by mouth.   clotrimazole (LOTRIMIN AF) 1 % cream Apply 1 Application topically 2 (two) times daily.   Cranberry-Vitamin C-Vitamin E (CRANBERRY PLUS VITAMIN C) 4200-20-3 MG-MG-UNIT CAPS Take by mouth.   fluticasone (FLONASE) 50 MCG/ACT nasal spray Place 2 sprays into the nose daily.   JANUVIA 50 MG tablet TAKE 1 TABLET BY MOUTH EVERY DAY IN THE MORNING   losartan (COZAAR) 100 MG tablet TAKE  1 TABLET BY MOUTH EVERY DAY IN THE MORNING   Multiple Vitamins-Minerals (WOMENS MULTI VITAMIN & MINERAL PO) Take by mouth.   OMEGA-3 KRILL OIL PO Take by mouth.   Vitamin D, Ergocalciferol, (DRISDOL) 1.25 MG (50000 UNIT) CAPS capsule Take 1 capsule (50,000 Units total) by mouth once a week.   No facility-administered medications prior to visit.    Review of Systems  Constitutional: Negative.   HENT: Negative.    Eyes: Negative.   Respiratory: Negative.  Negative for shortness of breath.   Cardiovascular: Negative.  Negative for chest pain.  Gastrointestinal: Negative.  Negative for abdominal pain, constipation and diarrhea.  Genitourinary: Negative.   Musculoskeletal:  Negative for joint pain and myalgias.  Skin: Negative.   Neurological: Negative.  Negative for dizziness and headaches.  Endo/Heme/Allergies: Negative.   All other systems reviewed and are negative.      Objective:   BP 118/78    Pulse 73   Ht 5\' 7"  (1.702 m)   Wt 200 lb 6.4 oz (90.9 kg)   SpO2 99%   BMI 31.39 kg/m   Vitals:   01/31/23 1014  BP: 118/78  Pulse: 73  Height: 5\' 7"  (1.702 m)  Weight: 200 lb 6.4 oz (90.9 kg)  SpO2: 99%  BMI (Calculated): 31.38    Physical Exam Vitals and nursing note reviewed.  Constitutional:      Appearance: Normal appearance. She is normal weight.  HENT:     Head: Normocephalic and atraumatic.     Nose: Nose normal.     Mouth/Throat:     Mouth: Mucous membranes are moist.  Eyes:     Extraocular Movements: Extraocular movements intact.     Conjunctiva/sclera: Conjunctivae normal.     Pupils: Pupils are equal, round, and reactive to light.  Cardiovascular:     Rate and Rhythm: Normal rate and regular rhythm.     Pulses: Normal pulses.     Heart sounds: Normal heart sounds.  Pulmonary:     Effort: Pulmonary effort is normal.     Breath sounds: Normal breath sounds.  Abdominal:     General: Abdomen is flat. Bowel sounds are normal.     Palpations: Abdomen is soft.  Musculoskeletal:        General: Normal range of motion.     Cervical back: Normal range of motion.  Skin:    General: Skin is warm and dry.  Neurological:     General: No focal deficit present.     Mental Status: She is alert and oriented to person, place, and time.  Psychiatric:        Mood and Affect: Mood normal.        Behavior: Behavior normal.        Thought Content: Thought content normal.        Judgment: Judgment normal.      Results for orders placed or performed in visit on 01/31/23  POCT CBG (Fasting - Glucose)  Result Value Ref Range   Glucose Fasting, POC 134 (A) 70 - 99 mg/dL    Recent Results (from the past 2160 hour(s))  CBC With Diff/Platelet     Status: None   Collection Time: 01/27/23 11:26 AM  Result Value Ref Range   WBC 6.2 3.4 - 10.8 x10E3/uL   RBC 4.18 3.77 - 5.28 x10E6/uL   Hemoglobin 12.2 11.1 - 15.9 g/dL   Hematocrit 16.1 09.6 - 46.6 %   MCV 90 79 - 97 fL  MCH 29.2 26.6 - 33.0 pg   MCHC 32.4 31.5 - 35.7 g/dL   RDW 69.6 29.5 - 28.4 %   Platelets 248 150 - 450 x10E3/uL   Neutrophils 57 Not Estab. %   Lymphs 36 Not Estab. %   Monocytes 5 Not Estab. %   Eos 1 Not Estab. %   Basos 1 Not Estab. %   Neutrophils Absolute 3.6 1.4 - 7.0 x10E3/uL   Lymphocytes Absolute 2.3 0.7 - 3.1 x10E3/uL   Monocytes Absolute 0.3 0.1 - 0.9 x10E3/uL   EOS (ABSOLUTE) 0.1 0.0 - 0.4 x10E3/uL   Basophils Absolute 0.0 0.0 - 0.2 x10E3/uL   Immature Granulocytes 0 Not Estab. %   Immature Grans (Abs) 0.0 0.0 - 0.1 x10E3/uL  Lipid panel     Status: None   Collection Time: 01/27/23 11:26 AM  Result Value Ref Range   Cholesterol, Total 162 100 - 199 mg/dL   Triglycerides 132 0 - 149 mg/dL   HDL 49 >44 mg/dL   VLDL Cholesterol Cal 22 5 - 40 mg/dL   LDL Chol Calc (NIH) 91 0 - 99 mg/dL   Chol/HDL Ratio 3.3 0.0 - 4.4 ratio    Comment:                                   T. Chol/HDL Ratio                                             Men  Women                               1/2 Avg.Risk  3.4    3.3                                   Avg.Risk  5.0    4.4                                2X Avg.Risk  9.6    7.1                                3X Avg.Risk 23.4   11.0   CMP14+EGFR     Status: Abnormal   Collection Time: 01/27/23 11:26 AM  Result Value Ref Range   Glucose 115 (H) 70 - 99 mg/dL   BUN 16 8 - 27 mg/dL   Creatinine, Ser 0.10 0.57 - 1.00 mg/dL   eGFR 72 >27 OZ/DGU/4.40   BUN/Creatinine Ratio 18 12 - 28   Sodium 140 134 - 144 mmol/L   Potassium 4.3 3.5 - 5.2 mmol/L   Chloride 101 96 - 106 mmol/L   CO2 25 20 - 29 mmol/L   Calcium 9.6 8.7 - 10.3 mg/dL   Total Protein 7.0 6.0 - 8.5 g/dL   Albumin 4.3 3.9 - 4.9 g/dL   Globulin, Total 2.7 1.5 - 4.5 g/dL   Bilirubin Total 0.8 0.0 - 1.2 mg/dL   Alkaline Phosphatase 106 44 - 121 IU/L   AST 22 0 - 40 IU/L  ALT 19 0 - 32 IU/L  TSH     Status: None   Collection Time: 01/27/23 11:26 AM  Result Value Ref Range   TSH  1.410 0.450 - 4.500 uIU/mL  Hemoglobin A1c     Status: Abnormal   Collection Time: 01/27/23 11:26 AM  Result Value Ref Range   Hgb A1c MFr Bld 6.5 (H) 4.8 - 5.6 %    Comment:          Prediabetes: 5.7 - 6.4          Diabetes: >6.4          Glycemic control for adults with diabetes: <7.0    Est. average glucose Bld gHb Est-mCnc 140 mg/dL  POCT CBG (Fasting - Glucose)     Status: Abnormal   Collection Time: 01/31/23 10:23 AM  Result Value Ref Range   Glucose Fasting, POC 134 (A) 70 - 99 mg/dL      Assessment & Plan:  Mammogram order placed.  Marcelline Deist in place of jardiance Keep appointments with specialists.   Problem List Items Addressed This Visit       Cardiovascular and Mediastinum   Essential hypertension, benign     Endocrine   Diabetes mellitus without complication (HCC)     Other   Breast cancer screening   Relevant Orders   MM 3D SCREENING MAMMOGRAM BILATERAL BREAST   Mixed hyperlipidemia   Prediabetes - Primary   Relevant Orders   POCT CBG (Fasting - Glucose) (Completed)    Return in about 4 months (around 05/31/2023) for with fasting labs prior.   Total time spent: 25 minutes  [employer], NP  01/31/2023   This document may have been prepared by Dragon Voice Recognition software and as such may include unintentional dictation errors.

## 2023-01-31 NOTE — Patient Instructions (Signed)
 [facility] [address], [address] [place],  [place]  [address]  Main: [phone]

## 2023-03-09 DIAGNOSIS — H2511 Age-related nuclear cataract, right eye: Secondary | ICD-10-CM | POA: Diagnosis not present

## 2023-03-09 DIAGNOSIS — H2512 Age-related nuclear cataract, left eye: Secondary | ICD-10-CM | POA: Diagnosis not present

## 2023-03-09 DIAGNOSIS — E11319 Type 2 diabetes mellitus with unspecified diabetic retinopathy without macular edema: Secondary | ICD-10-CM | POA: Diagnosis not present

## 2023-03-27 DIAGNOSIS — M25551 Pain in right hip: Secondary | ICD-10-CM | POA: Diagnosis not present

## 2023-04-26 ENCOUNTER — Other Ambulatory Visit: Payer: Self-pay

## 2023-04-26 DIAGNOSIS — E559 Vitamin D deficiency, unspecified: Secondary | ICD-10-CM

## 2023-04-26 MED ORDER — VITAMIN D (ERGOCALCIFEROL) 1.25 MG (50000 UNIT) PO CAPS: 50000.0000 [IU] | ORAL_CAPSULE | ORAL | 3 refills | Status: DC

## 2023-05-26 ENCOUNTER — Ambulatory Visit
Admission: RE | Admit: 2023-05-26 | Discharge: 2023-05-26 | Disposition: A | Discharge status: Home / Self Care | Payer: Medicare HMO | Source: Ambulatory Visit | Attending: Cardiology | Admitting: Cardiology

## 2023-05-26 DIAGNOSIS — Z1231 Encounter for screening mammogram for malignant neoplasm of breast: Secondary | ICD-10-CM | POA: Diagnosis not present

## 2023-05-30 ENCOUNTER — Other Ambulatory Visit

## 2023-05-30 DIAGNOSIS — E782 Mixed hyperlipidemia: Secondary | ICD-10-CM | POA: Diagnosis not present

## 2023-05-30 DIAGNOSIS — I10 Essential (primary) hypertension: Secondary | ICD-10-CM | POA: Diagnosis not present

## 2023-05-30 DIAGNOSIS — R7303 Prediabetes: Secondary | ICD-10-CM | POA: Diagnosis not present

## 2023-05-30 DIAGNOSIS — E119 Type 2 diabetes mellitus without complications: Secondary | ICD-10-CM | POA: Diagnosis not present

## 2023-05-31 LAB — CBC WITH DIFFERENTIAL/PLATELET
Basophils Absolute: 0 10*3/uL (ref 0.0–0.2)
Basos: 1 %
EOS (ABSOLUTE): 0.1 10*3/uL (ref 0.0–0.4)
Eos: 1 %
Hematocrit: 35.4 % (ref 34.0–46.6)
Hemoglobin: 11.7 g/dL (ref 11.1–15.9)
Immature Grans (Abs): 0 10*3/uL (ref 0.0–0.1)
Immature Granulocytes: 0 %
Lymphocytes Absolute: 3 10*3/uL (ref 0.7–3.1)
Lymphs: 50 %
MCH: 29 pg (ref 26.6–33.0)
MCHC: 33.1 g/dL (ref 31.5–35.7)
MCV: 88 fL (ref 79–97)
Monocytes Absolute: 0.3 10*3/uL (ref 0.1–0.9)
Monocytes: 5 %
Neutrophils Absolute: 2.5 10*3/uL (ref 1.4–7.0)
Neutrophils: 43 %
Platelets: 260 10*3/uL (ref 150–450)
RBC: 4.04 x10E6/uL (ref 3.77–5.28)
RDW: 14.5 % (ref 11.7–15.4)
WBC: 5.9 10*3/uL (ref 3.4–10.8)

## 2023-05-31 LAB — CMP14+EGFR
ALT: 20 IU/L (ref 0–32)
AST: 20 IU/L (ref 0–40)
Albumin: 4.5 g/dL (ref 3.9–4.9)
Alkaline Phosphatase: 110 IU/L (ref 44–121)
BUN/Creatinine Ratio: 19 (ref 12–28)
BUN: 15 mg/dL (ref 8–27)
Bilirubin Total: 0.7 mg/dL (ref 0.0–1.2)
CO2: 24 mmol/L (ref 20–29)
Calcium: 9.7 mg/dL (ref 8.7–10.3)
Chloride: 100 mmol/L (ref 96–106)
Creatinine, Ser: 0.79 mg/dL (ref 0.57–1.00)
Globulin, Total: 2.8 g/dL (ref 1.5–4.5)
Glucose: 112 mg/dL — ABNORMAL HIGH (ref 70–99)
Potassium: 4.3 mmol/L (ref 3.5–5.2)
Sodium: 140 mmol/L (ref 134–144)
Total Protein: 7.3 g/dL (ref 6.0–8.5)
eGFR: 80 mL/min/{1.73_m2} (ref >59)

## 2023-05-31 LAB — LIPID PANEL
Chol/HDL Ratio: 2.6 ratio (ref 0.0–4.4)
Cholesterol, Total: 162 mg/dL (ref 100–199)
HDL: 63 mg/dL (ref >39)
LDL Chol Calc (NIH): 74 mg/dL (ref 0–99)
Triglycerides: 144 mg/dL (ref 0–149)
VLDL Cholesterol Cal: 25 mg/dL (ref 5–40)

## 2023-05-31 LAB — HEMOGLOBIN A1C
Est. average glucose Bld gHb Est-mCnc: 131 mg/dL
Hgb A1c MFr Bld: 6.2 % — ABNORMAL HIGH (ref 4.8–5.6)

## 2023-06-01 ENCOUNTER — Encounter: Payer: Self-pay | Admitting: Cardiology

## 2023-06-01 ENCOUNTER — Ambulatory Visit (INDEPENDENT_AMBULATORY_CARE_PROVIDER_SITE_OTHER): Payer: Medicare HMO | Admitting: Cardiology

## 2023-06-01 VITALS — BP 130/66 | HR 87 | Ht 67.0 in | Wt 196.0 lb

## 2023-06-01 DIAGNOSIS — M25551 Pain in right hip: Secondary | ICD-10-CM | POA: Diagnosis not present

## 2023-06-01 DIAGNOSIS — E119 Type 2 diabetes mellitus without complications: Secondary | ICD-10-CM

## 2023-06-01 DIAGNOSIS — I10 Essential (primary) hypertension: Secondary | ICD-10-CM | POA: Diagnosis not present

## 2023-06-01 DIAGNOSIS — E782 Mixed hyperlipidemia: Secondary | ICD-10-CM

## 2023-06-01 DIAGNOSIS — R8290 Unspecified abnormal findings in urine: Secondary | ICD-10-CM

## 2023-06-01 LAB — POCT URINALYSIS DIPSTICK
Bilirubin, UA: NEGATIVE
Blood, UA: NEGATIVE
Glucose, UA: NEGATIVE
Ketones, UA: NEGATIVE
Leukocytes, UA: NEGATIVE
Nitrite, UA: NEGATIVE
Protein, UA: NEGATIVE
Spec Grav, UA: 1.025 (ref 1.010–1.025)
Urobilinogen, UA: 0.2 U/dL
pH, UA: 6 (ref 5.0–8.0)

## 2023-06-01 LAB — POCT UA - MICROALBUMIN
Albumin/Creatinine Ratio, Urine, POC: <30
Creatinine, POC: 100 mg/dL
Microalbumin Ur, POC: 30 mg/L

## 2023-06-01 MED ORDER — MELOXICAM 7.5 MG PO TABS: 7.5000 mg | ORAL_TABLET | Freq: Every day | ORAL | 2 refills | Status: DC

## 2023-06-01 NOTE — Progress Notes (Signed)
 Established Patient Office Visit  Subjective:  Patient ID: Jody Huffman, female    DOB: November 30, 1950  Age: 73 y.o. MRN: [id number]  Chief Complaint  Patient presents with   Follow-up    4 Months Follow Up W Labs Prior    Patient in office for 4 month follow up, discuss recent lab work. Patient reports feeling well overall. Complains of ongoing right hip pain. Seen at urgent care, given meloxicam. Reports pain improved. Pain returns when not taking meloxicam. Will refer to orthopaedics.  Urine micro albumin today.  Discussed recent lab work. Hgb A1c controlled. LDL improved. Continue same medications.     No other concerns at this time.   Past Medical History:  Diagnosis Date   Chicken pox    Heart murmur    High cholesterol    Prediabetes 05/09/2022   Right foot pain 10/08/2013   Shoulder pain 11/20/2012   UTI (lower urinary tract infection)     Past Surgical History:  Procedure Laterality Date   ABDOMINAL HYSTERECTOMY  1996   BREAST BIOPSY Right 15+ yrs ago   neg   BREAST BIOPSY Left 15+yrs ago   neg   BREAST BIOPSY Right 2016   neg   Right breast cyst removed     In her 30s    Social History   Socioeconomic History   Marital status: Divorced    Spouse name: Not on file   Number of children: Not on file   Years of education: Not on file   Highest education level: Not on file  Occupational History   Not on file  Tobacco Use   Smoking status: Never   Smokeless tobacco: Never  Substance and Sexual Activity   Alcohol use: No    Alcohol/week: 0.0 standard drinks of alcohol   Drug use: No   Sexual activity: Not on file  Other Topics Concern   Not on file  Social History Narrative   Not on file   Social Drivers of Health   Financial Resource Strain: Not on file  Food Insecurity: Not on file  Transportation Needs: Not on file  Physical Activity: Not on file  Stress: Not on file  Social Connections: Not on file  Intimate Partner Violence: Not on file     Family History  Problem Relation Age of Onset   Diabetes Mother    Kidney disease Mother 77       dialysis   Cancer Father 12       pancreatic cancer   Kidney disease Sister 58       dialysis   Diabetes Brother    Breast cancer Neg Hx     Allergies  Allergen Reactions   Bee Pollen     Outpatient Medications Prior to Visit  Medication Sig   amLODipine (NORVASC) 5 MG tablet TAKE 1 TABLET BY MOUTH DAILY IN AM FOR BLOOD PRESSURE   atorvastatin (LIPITOR) 40 MG tablet TAKE 1 TABLET ORALLY NIGHTLY AT BEDTIME FOR HIGH CHOLESTEROL   Calcium Carbonate-Vitamin D (CALCIUM-VITAMIN D3 PO) Take by mouth.   cetirizine (ZYRTEC) 10 MG tablet Take 10 mg by mouth daily.   Chromium-Cinnamon (CINNAMON PLUS CHROMIUM PO) Take by mouth.   clotrimazole (LOTRIMIN AF) 1 % cream Apply 1 Application topically 2 (two) times daily.   Cranberry-Vitamin C-Vitamin E (CRANBERRY PLUS VITAMIN C) 4200-20-3 MG-MG-UNIT CAPS Take by mouth.   fluticasone (FLONASE) 50 MCG/ACT nasal spray Place 2 sprays into the nose daily.   JANUVIA 50 MG  tablet TAKE 1 TABLET BY MOUTH EVERY DAY IN THE MORNING   losartan (COZAAR) 100 MG tablet TAKE 1 TABLET BY MOUTH EVERY DAY IN THE MORNING   Multiple Vitamins-Minerals (WOMENS MULTI VITAMIN & MINERAL PO) Take by mouth.   OMEGA-3 KRILL OIL PO Take by mouth.   Vitamin D, Ergocalciferol, (DRISDOL) 1.25 MG (50000 UNIT) CAPS capsule Take 1 capsule (50,000 Units total) by mouth once a week.   No facility-administered medications prior to visit.    Review of Systems  Constitutional: Negative.   HENT: Negative.    Eyes: Negative.   Respiratory: Negative.  Negative for shortness of breath.   Cardiovascular: Negative.  Negative for chest pain.  Gastrointestinal: Negative.  Negative for abdominal pain, constipation and diarrhea.  Genitourinary: Negative.   Musculoskeletal:  Positive for joint pain. Negative for myalgias.  Skin: Negative.   Neurological: Negative.  Negative for  dizziness and headaches.  Endo/Heme/Allergies: Negative.   All other systems reviewed and are negative.      Objective:   BP 130/66   Pulse 87   Ht 5\' 7"  (1.702 m)   Wt 196 lb (88.9 kg)   SpO2 97%   BMI 30.70 kg/m   Vitals:   06/01/23 1041  BP: 130/66  Pulse: 87  Height: 5\' 7"  (1.702 m)  Weight: 196 lb (88.9 kg)  SpO2: 97%  BMI (Calculated): 30.69    Physical Exam Vitals and nursing note reviewed.  Constitutional:      Appearance: Normal appearance. She is normal weight.  HENT:     Head: Normocephalic and atraumatic.     Nose: Nose normal.     Mouth/Throat:     Mouth: Mucous membranes are moist.  Eyes:     Extraocular Movements: Extraocular movements intact.     Conjunctiva/sclera: Conjunctivae normal.     Pupils: Pupils are equal, round, and reactive to light.  Cardiovascular:     Rate and Rhythm: Normal rate and regular rhythm.     Pulses: Normal pulses.     Heart sounds: Normal heart sounds.  Pulmonary:     Effort: Pulmonary effort is normal.     Breath sounds: Normal breath sounds.  Abdominal:     General: Abdomen is flat. Bowel sounds are normal.     Palpations: Abdomen is soft.  Musculoskeletal:        General: Normal range of motion.     Cervical back: Normal range of motion.  Skin:    General: Skin is warm and dry.  Neurological:     General: No focal deficit present.     Mental Status: She is alert and oriented to person, place, and time.  Psychiatric:        Mood and Affect: Mood normal.        Behavior: Behavior normal.        Thought Content: Thought content normal.        Judgment: Judgment normal.      Results for orders placed or performed in visit on 06/01/23  POCT Urine Albumin/Creatinine with ratio [[id number]]  Result Value Ref Range   Microalbumin Ur, POC 30 mg/L   Creatinine, POC 100 mg/dL   Albumin/Creatinine Ratio, Urine, POC <30   POCT Urinalysis Dipstick (81002)  Result Value Ref Range   Color, UA     Clarity, UA      Glucose, UA Negative Negative   Bilirubin, UA Negative    Ketones, UA Negative    Spec Grav, UA 1.025 1.010 -  1.025   Blood, UA Negative    pH, UA 6.0 5.0 - 8.0   Protein, UA Negative Negative   Urobilinogen, UA 0.2 0.2 or 1.0 E.U./dL   Nitrite, UA Negative    Leukocytes, UA Negative Negative   Appearance     Odor      Recent Results (from the past 2160 hours)  CBC with Differential/Platelet     Status: None   Collection Time: 05/30/23 11:32 AM  Result Value Ref Range   WBC 5.9 3.4 - 10.8 x10E3/uL   RBC 4.04 3.77 - 5.28 x10E6/uL   Hemoglobin 11.7 11.1 - 15.9 g/dL   Hematocrit 98.1 19.1 - 46.6 %   MCV 88 79 - 97 fL   MCH 29.0 26.6 - 33.0 pg   MCHC 33.1 31.5 - 35.7 g/dL   RDW 47.8 29.5 - 62.1 %   Platelets 260 150 - 450 x10E3/uL   Neutrophils 43 Not Estab. %   Lymphs 50 Not Estab. %   Monocytes 5 Not Estab. %   Eos 1 Not Estab. %   Basos 1 Not Estab. %   Neutrophils Absolute 2.5 1.4 - 7.0 x10E3/uL   Lymphocytes Absolute 3.0 0.7 - 3.1 x10E3/uL   Monocytes Absolute 0.3 0.1 - 0.9 x10E3/uL   EOS (ABSOLUTE) 0.1 0.0 - 0.4 x10E3/uL   Basophils Absolute 0.0 0.0 - 0.2 x10E3/uL   Immature Granulocytes 0 Not Estab. %   Immature Grans (Abs) 0.0 0.0 - 0.1 x10E3/uL  Lipid panel     Status: None   Collection Time: 05/30/23 11:32 AM  Result Value Ref Range   Cholesterol, Total 162 100 - 199 mg/dL   Triglycerides 308 0 - 149 mg/dL   HDL 63 >65 mg/dL   VLDL Cholesterol Cal 25 5 - 40 mg/dL   LDL Chol Calc (NIH) 74 0 - 99 mg/dL   Chol/HDL Ratio 2.6 0.0 - 4.4 ratio    Comment:                                   T. Chol/HDL Ratio                                             Men  Women                               1/2 Avg.Risk  3.4    3.3                                   Avg.Risk  5.0    4.4                                2X Avg.Risk  9.6    7.1                                3X Avg.Risk 23.4   11.0   CMP14+EGFR     Status: Abnormal   Collection Time: 05/30/23 11:32 AM  Result Value  Ref Range   Glucose 112 (H) 70 -  99 mg/dL   BUN 15 8 - 27 mg/dL   Creatinine, Ser 5.78 0.57 - 1.00 mg/dL   eGFR 80 >46 NG/EXB/2.84   BUN/Creatinine Ratio 19 12 - 28   Sodium 140 134 - 144 mmol/L   Potassium 4.3 3.5 - 5.2 mmol/L   Chloride 100 96 - 106 mmol/L   CO2 24 20 - 29 mmol/L   Calcium 9.7 8.7 - 10.3 mg/dL   Total Protein 7.3 6.0 - 8.5 g/dL   Albumin 4.5 3.9 - 4.9 g/dL   Globulin, Total 2.8 1.5 - 4.5 g/dL   Bilirubin Total 0.7 0.0 - 1.2 mg/dL   Alkaline Phosphatase 110 44 - 121 IU/L   AST 20 0 - 40 IU/L   ALT 20 0 - 32 IU/L  Hemoglobin A1c     Status: Abnormal   Collection Time: 05/30/23 11:32 AM  Result Value Ref Range   Hgb A1c MFr Bld 6.2 (H) 4.8 - 5.6 %    Comment:          Prediabetes: 5.7 - 6.4          Diabetes: >6.4          Glycemic control for adults with diabetes: <7.0    Est. average glucose Bld gHb Est-mCnc 131 mg/dL  POCT Urine Albumin/Creatinine with ratio [[id number]]     Status: Normal   Collection Time: 06/01/23 11:07 AM  Result Value Ref Range   Microalbumin Ur, POC 30 mg/L   Creatinine, POC 100 mg/dL   Albumin/Creatinine Ratio, Urine, POC <30   POCT Urinalysis Dipstick (27253)     Status: Normal   Collection Time: 06/01/23 11:08 AM  Result Value Ref Range   Color, UA     Clarity, UA     Glucose, UA Negative Negative   Bilirubin, UA Negative    Ketones, UA Negative    Spec Grav, UA 1.025 1.010 - 1.025   Blood, UA Negative    pH, UA 6.0 5.0 - 8.0   Protein, UA Negative Negative   Urobilinogen, UA 0.2 0.2 or 1.0 E.U./dL   Nitrite, UA Negative    Leukocytes, UA Negative Negative   Appearance     Odor        Assessment & Plan:  Meloxicam for hip pain Referral sent to orthopaedics Urine micro albumin today Continue same medications  Problem List Items Addressed This Visit       Cardiovascular and Mediastinum   Essential hypertension, benign - Primary     Endocrine   Diabetes mellitus without complication (HCC)   Relevant Orders    POCT Urine Albumin/Creatinine with ratio [[id number]] (Completed)     Other   Mixed hyperlipidemia   Other Visit Diagnoses       Pain of right hip       Relevant Orders   AMB referral to orthopedics     Abnormal urine findings       Relevant Orders   POCT Urinalysis Dipstick (42595) (Completed)       Return in about 4 months (around 10/01/2023) for with fasing labs prior.   Total time spent: 25 minutes  Google, NP  06/01/2023   This document may have been prepared by Dragon Voice Recognition software and as such may include unintentional dictation errors.

## 2023-06-05 DIAGNOSIS — K5790 Diverticulosis of intestine, part unspecified, without perforation or abscess without bleeding: Secondary | ICD-10-CM | POA: Diagnosis not present

## 2023-06-05 DIAGNOSIS — K5909 Other constipation: Secondary | ICD-10-CM | POA: Diagnosis not present

## 2023-06-05 DIAGNOSIS — Z860101 Personal history of adenomatous and serrated colon polyps: Secondary | ICD-10-CM | POA: Diagnosis not present

## 2023-06-12 ENCOUNTER — Other Ambulatory Visit: Payer: Self-pay

## 2023-06-12 DIAGNOSIS — I10 Essential (primary) hypertension: Secondary | ICD-10-CM

## 2023-06-12 MED ORDER — LOSARTAN POTASSIUM 100 MG PO TABS
100.0000 mg | ORAL_TABLET | Freq: Every day | ORAL | 3 refills | Status: AC
Start: 2023-06-12 — End: ?

## 2023-06-21 ENCOUNTER — Other Ambulatory Visit: Payer: Self-pay

## 2023-06-22 MED ORDER — AMLODIPINE BESYLATE 5 MG PO TABS: 5.0000 mg | ORAL_TABLET | Freq: Every day | ORAL | 3 refills | Status: AC

## 2023-07-10 ENCOUNTER — Other Ambulatory Visit: Payer: Self-pay

## 2023-07-10 DIAGNOSIS — E785 Hyperlipidemia, unspecified: Secondary | ICD-10-CM

## 2023-07-10 MED ORDER — ATORVASTATIN CALCIUM 40 MG PO TABS: 40.0000 mg | ORAL_TABLET | Freq: Every day | ORAL | 3 refills | Status: AC

## 2023-07-28 ENCOUNTER — Ambulatory Visit

## 2023-07-28 DIAGNOSIS — D120 Benign neoplasm of cecum: Secondary | ICD-10-CM | POA: Diagnosis not present

## 2023-07-28 DIAGNOSIS — K640 First degree hemorrhoids: Secondary | ICD-10-CM | POA: Diagnosis not present

## 2023-07-28 DIAGNOSIS — K635 Polyp of colon: Secondary | ICD-10-CM | POA: Diagnosis not present

## 2023-07-28 DIAGNOSIS — Z09 Encounter for follow-up examination after completed treatment for conditions other than malignant neoplasm: Secondary | ICD-10-CM | POA: Diagnosis not present

## 2023-07-28 DIAGNOSIS — K5730 Diverticulosis of large intestine without perforation or abscess without bleeding: Secondary | ICD-10-CM | POA: Diagnosis not present

## 2023-07-28 DIAGNOSIS — Z860100 Personal history of colon polyps, unspecified: Secondary | ICD-10-CM | POA: Diagnosis not present

## 2023-07-28 DIAGNOSIS — Z860101 Personal history of adenomatous and serrated colon polyps: Secondary | ICD-10-CM | POA: Diagnosis not present

## 2023-08-27 ENCOUNTER — Other Ambulatory Visit: Payer: Self-pay | Admitting: Cardiology

## 2023-10-02 ENCOUNTER — Other Ambulatory Visit: Payer: Self-pay | Admitting: Cardiology

## 2023-10-02 ENCOUNTER — Ambulatory Visit: Admitting: Cardiology

## 2023-10-02 DIAGNOSIS — B353 Tinea pedis: Secondary | ICD-10-CM

## 2023-10-09 ENCOUNTER — Other Ambulatory Visit

## 2023-10-09 DIAGNOSIS — I10 Essential (primary) hypertension: Secondary | ICD-10-CM

## 2023-10-09 DIAGNOSIS — R7303 Prediabetes: Secondary | ICD-10-CM

## 2023-10-09 DIAGNOSIS — E782 Mixed hyperlipidemia: Secondary | ICD-10-CM

## 2023-10-09 DIAGNOSIS — E119 Type 2 diabetes mellitus without complications: Secondary | ICD-10-CM

## 2023-10-10 ENCOUNTER — Ambulatory Visit: Payer: Self-pay | Admitting: Cardiology

## 2023-10-10 ENCOUNTER — Encounter: Payer: Self-pay | Admitting: Cardiology

## 2023-10-10 ENCOUNTER — Ambulatory Visit (INDEPENDENT_AMBULATORY_CARE_PROVIDER_SITE_OTHER): Admitting: Cardiology

## 2023-10-10 VITALS — BP 126/78 | HR 77 | Ht 67.0 in | Wt 197.8 lb

## 2023-10-10 DIAGNOSIS — E782 Mixed hyperlipidemia: Secondary | ICD-10-CM

## 2023-10-10 DIAGNOSIS — E119 Type 2 diabetes mellitus without complications: Secondary | ICD-10-CM

## 2023-10-10 DIAGNOSIS — I10 Essential (primary) hypertension: Secondary | ICD-10-CM | POA: Diagnosis not present

## 2023-10-10 LAB — CBC WITH DIFFERENTIAL/PLATELET
Basophils Absolute: 0 x10E3/uL (ref 0.0–0.2)
Basos: 0 %
EOS (ABSOLUTE): 0.1 x10E3/uL (ref 0.0–0.4)
Eos: 2 %
Hematocrit: 34.5 % (ref 34.0–46.6)
Hemoglobin: 11.4 g/dL (ref 11.1–15.9)
Immature Grans (Abs): 0 x10E3/uL (ref 0.0–0.1)
Immature Granulocytes: 0 %
Lymphocytes Absolute: 2.2 x10E3/uL (ref 0.7–3.1)
Lymphs: 40 %
MCH: 29.5 pg (ref 26.6–33.0)
MCHC: 33 g/dL (ref 31.5–35.7)
MCV: 89 fL (ref 79–97)
Monocytes Absolute: 0.3 x10E3/uL (ref 0.1–0.9)
Monocytes: 5 %
Neutrophils Absolute: 2.9 x10E3/uL (ref 1.4–7.0)
Neutrophils: 53 %
Platelets: 250 x10E3/uL (ref 150–450)
RBC: 3.86 x10E6/uL (ref 3.77–5.28)
RDW: 14.7 % (ref 11.7–15.4)
WBC: 5.6 x10E3/uL (ref 3.4–10.8)

## 2023-10-10 LAB — CMP14+EGFR
ALT: 18 IU/L (ref 0–32)
AST: 22 IU/L (ref 0–40)
Albumin: 4.3 g/dL (ref 3.8–4.8)
Alkaline Phosphatase: 120 IU/L (ref 44–121)
BUN/Creatinine Ratio: 19 (ref 12–28)
BUN: 15 mg/dL (ref 8–27)
Bilirubin Total: 0.6 mg/dL (ref 0.0–1.2)
CO2: 26 mmol/L (ref 20–29)
Calcium: 9.4 mg/dL (ref 8.7–10.3)
Chloride: 102 mmol/L (ref 96–106)
Creatinine, Ser: 0.8 mg/dL (ref 0.57–1.00)
Globulin, Total: 2.7 g/dL (ref 1.5–4.5)
Glucose: 110 mg/dL — ABNORMAL HIGH (ref 70–99)
Potassium: 4.1 mmol/L (ref 3.5–5.2)
Sodium: 142 mmol/L (ref 134–144)
Total Protein: 7 g/dL (ref 6.0–8.5)
eGFR: 79 mL/min/1.73 (ref >59)

## 2023-10-10 LAB — LIPID PANEL
Chol/HDL Ratio: 2.7 ratio (ref 0.0–4.4)
Cholesterol, Total: 159 mg/dL (ref 100–199)
HDL: 60 mg/dL (ref >39)
LDL Chol Calc (NIH): 83 mg/dL (ref 0–99)
Triglycerides: 87 mg/dL (ref 0–149)
VLDL Cholesterol Cal: 16 mg/dL (ref 5–40)

## 2023-10-10 LAB — HEMOGLOBIN A1C
Est. average glucose Bld gHb Est-mCnc: 134 mg/dL
Hgb A1c MFr Bld: 6.3 % — ABNORMAL HIGH (ref 4.8–5.6)

## 2023-10-10 MED ORDER — DAPAGLIFLOZIN PROPANEDIOL 10 MG PO TABS
10.0000 mg | ORAL_TABLET | Freq: Every day | ORAL | 5 refills | Status: AC
Start: 2023-10-10 — End: ?

## 2023-10-10 NOTE — Progress Notes (Signed)
 Established Patient Office Visit  Subjective:  Patient ID: Jody Huffman, female    DOB: October 04, 1950  Age: 72 y.o. MRN: [id number]  Chief Complaint  Patient presents with   Follow-up    Patient in office for 4 month follow up, discuss recent lab results. Patient doing well. Patient reports recently having dry mouth, states her lips have turned darker, questioning if it may be dehydration Recommend increasing water intake. Notify provider if symptoms worsen.  Discussed recent lab work. Labs stable.  Patient is up to date on routine maintenance exams.  Continue same medications.     No other concerns at this time.   Past Medical History:  Diagnosis Date   Chicken pox    Heart murmur    High cholesterol    Numbness and tingling 05/29/2012   Prediabetes 05/09/2022   Right foot pain 10/08/2013   Shoulder pain 11/20/2012   UTI (lower urinary tract infection)     Past Surgical History:  Procedure Laterality Date   ABDOMINAL HYSTERECTOMY  1996   BREAST BIOPSY Right 15+ yrs ago   neg   BREAST BIOPSY Left 15+yrs ago   neg   BREAST BIOPSY Right 2016   neg   Right breast cyst removed     In her 30s    Social History   Socioeconomic History   Marital status: Divorced    Spouse name: Not on file   Number of children: Not on file   Years of education: Not on file   Highest education level: Not on file  Occupational History   Not on file  Tobacco Use   Smoking status: Never   Smokeless tobacco: Never  Substance and Sexual Activity   Alcohol use: No    Alcohol/week: 0.0 standard drinks of alcohol   Drug use: No   Sexual activity: Not on file  Other Topics Concern   Not on file  Social History Narrative   Not on file   Social Drivers of Health   Financial Resource Strain: Not on file  Food Insecurity: Not on file  Transportation Needs: Not on file  Physical Activity: Not on file  Stress: Not on file  Social Connections: Not on file  Intimate Partner Violence:  Not on file    Family History  Problem Relation Age of Onset   Diabetes Mother    Kidney disease Mother 45       dialysis   Cancer Father 70       pancreatic cancer   Kidney disease Sister 36       dialysis   Diabetes Brother    Breast cancer Neg Hx     Allergies  Allergen Reactions   Bee Pollen     Outpatient Medications Prior to Visit  Medication Sig   amLODipine  (NORVASC ) 5 MG tablet Take 1 tablet (5 mg total) by mouth daily.   atorvastatin  (LIPITOR) 40 MG tablet Take 1 tablet (40 mg total) by mouth daily.   Calcium  Carbonate-Vitamin D  (CALCIUM -VITAMIN D3 PO) Take by mouth.   cetirizine (ZYRTEC) 10 MG tablet Take 10 mg by mouth daily.   Chromium-Cinnamon (CINNAMON PLUS CHROMIUM PO) Take by mouth.   clotrimazole  (LOTRIMIN ) 1 % cream APPLY TO AFFECTED AREA TWICE A DAY   Cranberry-Vitamin C-Vitamin E (CRANBERRY PLUS VITAMIN C) 4200-20-3 MG-MG-UNIT CAPS Take by mouth.   fluticasone  (FLONASE ) 50 MCG/ACT nasal spray Place 2 sprays into the nose daily.   JANUVIA 50 MG tablet TAKE 1 TABLET BY MOUTH  EVERY DAY IN THE MORNING   losartan  (COZAAR ) 100 MG tablet Take 1 tablet (100 mg total) by mouth daily.   meloxicam  (MOBIC ) 7.5 MG tablet TAKE 1 TABLET BY MOUTH EVERY DAY   Multiple Vitamins-Minerals (WOMENS MULTI VITAMIN & MINERAL PO) Take by mouth.   OMEGA-3 KRILL OIL PO Take by mouth.   Vitamin D , Ergocalciferol , (DRISDOL ) 1.25 MG (50000 UNIT) CAPS capsule Take 1 capsule (50,000 Units total) by mouth once a week.   [DISCONTINUED] dapagliflozin  propanediol (FARXIGA ) 10 MG TABS tablet Take 10 mg by mouth daily.   No facility-administered medications prior to visit.    Review of Systems  Constitutional: Negative.   HENT: Negative.    Eyes: Negative.   Respiratory: Negative.  Negative for shortness of breath.   Cardiovascular: Negative.  Negative for chest pain.  Gastrointestinal: Negative.  Negative for abdominal pain, constipation and diarrhea.  Genitourinary: Negative.    Musculoskeletal:  Negative for joint pain and myalgias.  Skin: Negative.   Neurological: Negative.  Negative for dizziness and headaches.  Endo/Heme/Allergies: Negative.   All other systems reviewed and are negative.      Objective:   BP 126/78   Pulse 77   Ht 5' 7 (1.702 m)   Wt 197 lb 12.8 oz (89.7 kg)   SpO2 98%   BMI 30.98 kg/m   Vitals:   10/10/23 1002  BP: 126/78  Pulse: 77  Height: 5' 7 (1.702 m)  Weight: 197 lb 12.8 oz (89.7 kg)  SpO2: 98%  BMI (Calculated): 30.97    Physical Exam Vitals and nursing note reviewed.  Constitutional:      Appearance: Normal appearance. She is normal weight.  HENT:     Head: Normocephalic and atraumatic.     Nose: Nose normal.     Mouth/Throat:     Mouth: Mucous membranes are moist.  Eyes:     Extraocular Movements: Extraocular movements intact.     Conjunctiva/sclera: Conjunctivae normal.     Pupils: Pupils are equal, round, and reactive to light.  Cardiovascular:     Rate and Rhythm: Normal rate and regular rhythm.     Pulses: Normal pulses.     Heart sounds: Normal heart sounds.  Pulmonary:     Effort: Pulmonary effort is normal.     Breath sounds: Normal breath sounds.  Abdominal:     General: Abdomen is flat. Bowel sounds are normal.     Palpations: Abdomen is soft.  Musculoskeletal:        General: Normal range of motion.     Cervical back: Normal range of motion.  Skin:    General: Skin is warm and dry.  Neurological:     General: No focal deficit present.     Mental Status: She is alert and oriented to person, place, and time.  Psychiatric:        Mood and Affect: Mood normal.        Behavior: Behavior normal.        Thought Content: Thought content normal.        Judgment: Judgment normal.      No results found for any visits on 10/10/23.  Recent Results (from the past 2160 hours)  Hemoglobin A1c     Status: Abnormal   Collection Time: 10/09/23 10:11 AM  Result Value Ref Range   Hgb A1c MFr  Bld 6.3 (H) 4.8 - 5.6 %    Comment:          Prediabetes: 5.7 -  6.4          Diabetes: >6.4          Glycemic control for adults with diabetes: <7.0    Est. average glucose Bld gHb Est-mCnc 134 mg/dL  [id number]     Status: Abnormal   Collection Time: 10/09/23 10:11 AM  Result Value Ref Range   Glucose 110 (H) 70 - 99 mg/dL   BUN 15 8 - 27 mg/dL   Creatinine, Ser 9.19 0.57 - 1.00 mg/dL   eGFR 79 >40 fO/fpw/8.26   BUN/Creatinine Ratio 19 12 - 28   Sodium 142 134 - 144 mmol/L   Potassium 4.1 3.5 - 5.2 mmol/L   Chloride 102 96 - 106 mmol/L   CO2 26 20 - 29 mmol/L   Calcium  9.4 8.7 - 10.3 mg/dL   Total Protein 7.0 6.0 - 8.5 g/dL   Albumin 4.3 3.8 - 4.8 g/dL   Globulin, Total 2.7 1.5 - 4.5 g/dL   Bilirubin Total 0.6 0.0 - 1.2 mg/dL   Alkaline Phosphatase 120 44 - 121 IU/L   AST 22 0 - 40 IU/L   ALT 18 0 - 32 IU/L  Lipid panel     Status: None   Collection Time: 10/09/23 10:11 AM  Result Value Ref Range   Cholesterol, Total 159 100 - 199 mg/dL   Triglycerides 87 0 - 149 mg/dL   HDL 60 >60 mg/dL   VLDL Cholesterol Cal 16 5 - 40 mg/dL   LDL Chol Calc (NIH) 83 0 - 99 mg/dL   Chol/HDL Ratio 2.7 0.0 - 4.4 ratio    Comment:                                   T. Chol/HDL Ratio                                             Men  Women                               1/2 Avg.Risk  3.4    3.3                                   Avg.Risk  5.0    4.4                                2X Avg.Risk  9.6    7.1                                3X Avg.Risk 23.4   11.0   CBC with Differential/Platelet     Status: None   Collection Time: 10/09/23 10:11 AM  Result Value Ref Range   WBC 5.6 3.4 - 10.8 x10E3/uL   RBC 3.86 3.77 - 5.28 x10E6/uL   Hemoglobin 11.4 11.1 - 15.9 g/dL   Hematocrit 65.4 65.9 - 46.6 %   MCV 89 79 - 97 fL   MCH 29.5 26.6 - 33.0 pg   MCHC 33.0 31.5 - 35.7 g/dL   RDW 85.2 88.2 -  15.4 %   Platelets 250 150 - 450 x10E3/uL   Neutrophils 53 Not Estab. %   Lymphs 40 Not Estab. %    Monocytes 5 Not Estab. %   Eos 2 Not Estab. %   Basos 0 Not Estab. %   Neutrophils Absolute 2.9 1.4 - 7.0 x10E3/uL   Lymphocytes Absolute 2.2 0.7 - 3.1 x10E3/uL   Monocytes Absolute 0.3 0.1 - 0.9 x10E3/uL   EOS (ABSOLUTE) 0.1 0.0 - 0.4 x10E3/uL   Basophils Absolute 0.0 0.0 - 0.2 x10E3/uL   Immature Granulocytes 0 Not Estab. %   Immature Grans (Abs) 0.0 0.0 - 0.1 x10E3/uL      Assessment & Plan:  Continue same medications.   Problem List Items Addressed This Visit       Cardiovascular and Mediastinum   Essential hypertension, benign - Primary     Endocrine   Diabetes mellitus without complication (HCC)   Relevant Medications   dapagliflozin  propanediol (FARXIGA ) 10 MG TABS tablet     Other   Mixed hyperlipidemia    Return in about 4 months (around 02/10/2024) for fasting labs prior.   Total time spent: 25 minutes  Google, NP  10/10/2023   This document may have been prepared by Dragon Voice Recognition software and as such may include unintentional dictation errors.

## 2023-10-19 DIAGNOSIS — M25551 Pain in right hip: Secondary | ICD-10-CM | POA: Diagnosis not present

## 2023-10-19 DIAGNOSIS — M5416 Radiculopathy, lumbar region: Secondary | ICD-10-CM | POA: Diagnosis not present

## 2023-11-29 ENCOUNTER — Other Ambulatory Visit: Payer: Self-pay | Admitting: Cardiology

## 2023-12-21 ENCOUNTER — Telehealth: Payer: Self-pay

## 2023-12-21 NOTE — Telephone Encounter (Signed)
 Patient came by the office today stating that the 7.5mg  Meloxicam  isnt working she's still hurting  can she double up on this or can you send her in something different?

## 2023-12-22 ENCOUNTER — Other Ambulatory Visit: Payer: Self-pay | Admitting: Cardiology

## 2023-12-22 MED ORDER — MELOXICAM 15 MG PO TABS: 15.0000 mg | ORAL_TABLET | Freq: Every day | ORAL | 5 refills | Status: AC

## 2023-12-22 NOTE — Telephone Encounter (Signed)
 Patient informed - ao

## 2024-01-02 DIAGNOSIS — M47816 Spondylosis without myelopathy or radiculopathy, lumbar region: Secondary | ICD-10-CM | POA: Diagnosis not present

## 2024-01-02 DIAGNOSIS — M5416 Radiculopathy, lumbar region: Secondary | ICD-10-CM | POA: Diagnosis not present

## 2024-01-03 ENCOUNTER — Other Ambulatory Visit: Payer: Self-pay | Admitting: Family Medicine

## 2024-01-03 DIAGNOSIS — M5416 Radiculopathy, lumbar region: Secondary | ICD-10-CM

## 2024-01-05 ENCOUNTER — Ambulatory Visit
Admission: RE | Admit: 2024-01-05 | Discharge: 2024-01-05 | Disposition: A | Discharge status: Home / Self Care | Source: Ambulatory Visit | Attending: Family Medicine | Admitting: Family Medicine

## 2024-01-05 DIAGNOSIS — M5416 Radiculopathy, lumbar region: Secondary | ICD-10-CM

## 2024-01-05 DIAGNOSIS — M5116 Intervertebral disc disorders with radiculopathy, lumbar region: Secondary | ICD-10-CM | POA: Diagnosis not present

## 2024-01-05 DIAGNOSIS — M4727 Other spondylosis with radiculopathy, lumbosacral region: Secondary | ICD-10-CM | POA: Diagnosis not present

## 2024-01-05 DIAGNOSIS — M48061 Spinal stenosis, lumbar region without neurogenic claudication: Secondary | ICD-10-CM | POA: Diagnosis not present

## 2024-01-25 DIAGNOSIS — M48062 Spinal stenosis, lumbar region with neurogenic claudication: Secondary | ICD-10-CM | POA: Diagnosis not present

## 2024-01-25 DIAGNOSIS — M5416 Radiculopathy, lumbar region: Secondary | ICD-10-CM | POA: Diagnosis not present

## 2024-01-25 DIAGNOSIS — M47816 Spondylosis without myelopathy or radiculopathy, lumbar region: Secondary | ICD-10-CM | POA: Diagnosis not present

## 2024-01-25 NOTE — Progress Notes (Signed)
 HPI The patient is a pleasant 73 year old female who presents today for follow-up of acute on chronic right-sided low back pain with right buttock pain radiating to the right lateral thigh and calf into the ankle. Pain is rated severe sharp aching and constant that is unchanged since beginning in August 2025 at which time she describes no injury.  However she is active with her church and has been packing food boxes.  Pain is worsening when lying in bed and alleviated with some elevation.  Pain is most notable for Singh in the morning when up and walking.  Denies any loss of control of bowel or bladder or saddle anesthesia.  She has not been able to participate in normal activities during the day due to pain.  At times her pain will wake her up at night.  Medications have included meloxicam  (little relief), Tylenol (little relief).  She lives alone however her son comes over every day.  She was initially evaluated on 01/02/2024 at which time she was experiencing acute on chronic right-sided low back pain with rating pain to the right buttock right lateral thigh and calf into the ankle.  She was to continue with Tylenol and MRI was ordered.  MRI of the lumbar spine revealed mild central stenosis at L4-5 and L2-3 with facet degenerative changes.  At today's visit she describes continued pain in the right lower extremity.  Today we discussed her MRI and viewed imaging together.  We discussed epidural steroid injections and she wishes to schedule for this today.  She states that tramadol was not helpful in managing her pain.  Today we discussed trialing hydrocodone and she wishes to move forward with this.  She has been doing home exercises and we discussed physical therapy for which she wishes to move forward with.  The [place]  Narcotic Database was reviewed today.   Past Medical History:  Diagnosis Date   Diabetes (CMS/HHS-HCC)    Diabetes mellitus without complication (CMS/HHS-HCC) 09/30/2022    Essential hypertension, benign 05/09/2022   Hyperlipidemia    Hypertension    Mixed hyperlipidemia 05/09/2022    Past Surgical History:  Procedure Laterality Date   COLONOSCOPY  01/11/1999   Dr. DOROTHA Cota @ [facility] - lipoma - colonic mucosa   COLONOSCOPY  04/30/2004   Dr. D.Siegel @ [facility] - Int. Hemorrhoids, Diverticulosis   COLONOSCOPY N/A 01/10/2020   Dr. IVAR Boss @ [facility] - Tubulovillous Adenoma (8mm)   COLONOSCOPY  01/10/2020   Tubulovillous adenoma/Repeat 57yrs/TKT   Colon @ [facility]  07/28/2023   Tubular adenoma/Repeat 39yrs/TKT   breast surgery     HYSTERECTOMY VAGINAL      Social History   Socioeconomic History   Marital status: Single   Number of children: 1   Years of education: 14  Occupational History    Comment: RETIRED  Tobacco Use   Smoking status: Never   Smokeless tobacco: Never  Vaping Use   Vaping status: Never Used  Substance and Sexual Activity   Alcohol use: Not Currently   Drug use: Never   Social Drivers of Health   Financial Resource Strain: Low Risk  (01/02/2024)   Overall Financial Resource Strain (CARDIA)    Difficulty of Paying Living Expenses: Not hard at all  Food Insecurity: No Food Insecurity (01/02/2024)   Hunger Vital Sign    Worried About Running Out of Food in the Last Year: Never true    Ran Out of Food in the Last Year: Never true  Transportation Needs:  No Transportation Needs (01/02/2024)   PRAPARE - Administrator, Civil Service (Medical): No    Lack of Transportation (Non-Medical): No    No family history on file.  Current Outpatient Medications on File Prior to Visit  Medication Sig Dispense Refill   amLODIPine  (NORVASC ) 2.5 MG tablet Take 2.5 mg by mouth once daily     amLODIPine  (NORVASC ) 5 MG tablet Take 5 mg by mouth once daily     atorvastatin  (LIPITOR) 40 MG tablet Take 40 mg by mouth once daily     CALCIUM  ORAL Take by mouth     cetirizine (ZYRTEC) 10 mg capsule Take 10  mg by mouth nightly     cinnamon bark (CINNAMON) 500 mg capsule Take 1,000 mg by mouth once daily     clotrimazole  (LOTRIMIN ) 1 % cream Apply 1 % topically 2 (two) times daily APPLY TO AFFECTED AREA     cranberry conc-ascorbic acid 4,200-20 mg Cap Take by mouth     dapagliflozin  propanediol (FARXIGA ) 10 mg tablet Take 10 mg by mouth once daily     ergocalciferol , vitamin D2, 1,250 mcg (50,000 unit) capsule Take 50,000 Units by mouth once a week     fluticasone  propionate (FLONASE ) 50 mcg/actuation nasal spray Place 2 sprays into both nostrils once daily     losartan  (COZAAR ) 100 MG tablet Take 100 mg by mouth once daily     meloxicam  (MOBIC ) 7.5 MG tablet Take 7.5 mg by mouth once daily     naproxen  (NAPROSYN ) 500 MG tablet Take 500 mg by mouth 2 (two) times daily with meals     traMADoL (ULTRAM) 50 mg tablet Take 1 tablet (50 mg total) by mouth 2 (two) times daily as needed 1 po bid prn 10 tablet 0   No current facility-administered medications on file prior to visit.    Allergies as of 01/25/2024 - Reviewed 01/25/2024  Allergen Reaction Noted   Bee pollen Other (See Comments) 05/09/2022    ROS More than 10 system, review of system form was given to the patient to fill out and has been signed by [employer] FNP-C and scanned into the patient's chart.   Vital signs Vitals:   01/25/24 0804  BP: 134/82  Pulse: 70  Temp: 36.9 C (98.4 F)  TempSrc: Oral  Weight: 89.8 kg (197 lb 15.6 oz)  Height: 170.2 cm (5' 7.01)    Exam General: Alert oriented well-nourished no distress  Lumbosacral Exam (performed 01/02/2024) Upon inspection there are no rashes or scars.  Patient is nontender palpation to the lumbosacral paraspinal musculature.  Extension rotation does not produce pain in the low back.  Forward flexion does not produce pain.  Lower extremity exam The patient has 5/5 strength in the bilateral dorsiflexors, knee extensors, hip flexors.  Sensation intact to light  touch bilaterally.  absent bilateral patellar and Achilles reflexes.  No ankle clonus.  Straight leg raise is negative bilaterally.  Full range of motion to bilateral hip joints without pain elicited.  Radiographic Data Lumbar spine x-rays from [facility] dated 10/19/2023 revealed mild to moderate facet degenerative changes and mild to space narrowing.  Right hip x-ray from [facility] dated 10/19/2023 revealed no acute findings.  Impression 1.  Acute on chronic right-sided low back pain with right buttock pain radiating to the right lateral thigh and calf into the ankle.  Clinically her symptoms are most consistent with elements of a lumbar radiculitis.  MRI lumbar spine without contrast 01/05/2024  Imaging and report reviewed today.  TECHNIQUE:  Multiplanar, multisequence MR imaging of the lumbar spine was  performed. No intravenous contrast was administered.   COMPARISON:  None Available.   FINDINGS:  Segmentation: Standard.   Alignment:  Grade 1 anterolisthesis of L4 on L5 and L5 on S1.   Vertebrae: Reactive marrow edema associated with the L4-L5 facet  joints, left worse than right. No fracture. No evidence of discitis.  No marrow replacing bone lesion.   Conus medullaris and cauda equina: Conus extends to the L1-L2 level.  Conus and cauda equina appear normal.   Paraspinal and other soft tissues: Cholelithiasis.   Disc levels:   T12-L1: Mild bilateral facet arthropathy without foraminal or canal  stenosis.   L1-L2: Moderate bilateral facet arthropathy without foraminal or  canal stenosis.   L2-L3: Moderate bilateral facet arthropathy and ligamentum flavum  buckling contributing to mild canal stenosis. No significant  foraminal stenosis.   L3-L4: Moderate bilateral facet arthropathy with ligamentum flavum  buckling. No significant foraminal or canal stenosis.   L4-L5: Advanced bilateral facet arthropathy with ligamentum flavum  buckling. Mild annular disc  bulge. Bilateral subarticular recess  stenosis with mild canal stenosis. Mild-moderate right and mild left  foraminal stenosis.   L5-S1: Moderate bilateral facet arthropathy. No canal stenosis. No  significant foraminal stenosis.   IMPRESSION:  1. Multilevel lumbar spondylosis most pronounced at L4-L5 where  there is mild canal stenosis with mild-moderate right and mild left  foraminal stenosis.  2. Mild canal stenosis at L2-L3.  3. Reactive marrow edema associated with the L4-L5 facet joints,  left worse than right, which can be a focal source of back pain.  4. Cholelithiasis.    Electronically Signed    By: Mabel Converse D.O.    On: 01/09/2024 10:16      2.  HTN 3.  DM type 2 last A1c was 6.3 in July 2025   Plan 1.  Continue with Tylenol twice daily as needed 2.  Norco 5/325 1 tablet twice daily as needed #10 sent to pharmacy.  Side effects discussed. 3.  Order placed for right L4-5 TF ESI  4.  Referral to physical therapy at [facility] 5.  She will follow-up with me 3 weeks after epidural steroid injection for reevaluation.  Social Drivers of Health with Concerns   Housing Stability: Unknown (01/02/2024)   Housing Stability Vital Sign    Unable to Pay for Housing in the Last Year: No    Homeless in the Last Year: No      Attestation Statement:   I personally performed the service, non-incident to. (WP)   WHITNEY MEELER, NP    BENTON DOWSE, NP  This note was generated in part with voice recognition software and I apologize for any typographical errors that were not detected and corrected.  PCP: Jeoffrey Pollen NP

## 2024-02-13 DIAGNOSIS — M48062 Spinal stenosis, lumbar region with neurogenic claudication: Secondary | ICD-10-CM | POA: Diagnosis not present

## 2024-02-13 DIAGNOSIS — M5416 Radiculopathy, lumbar region: Secondary | ICD-10-CM | POA: Diagnosis not present

## 2024-02-13 NOTE — Progress Notes (Signed)
 [facility] - Physiatry  PROCEDURE: 1. A Right L5-S1 transforaminal epidural injection under fluoroscopic guidance. 2. Use of fluoroscopic guidance was required to ensure adequate delivery of medication into the epidural space and for proper needle placement. 3. The patient was monitored with continuous pulse oximetry throughout the procedure.   DIAGNOSIS/INDICATION FOR PROCEDURE: The patient is a pleasant 73 year old female followed by [facility] for acute on chronic low back pain with radiation into the right buttock, lateral thigh, and lateral calf.  Clinically she has symptoms consistent with a lumbosacral radiculitis, potentially L5 distribution.  MRI lumbar spine without contrast 01/05/2024 Imaging and report reviewed today. TECHNIQUE:  Multiplanar, multisequence MR imaging of the lumbar spine was  performed. No intravenous contrast was administered.  COMPARISON:  None Available.  FINDINGS:  Segmentation: Standard.  Alignment:  Grade 1 anterolisthesis of L4 on L5 and L5 on S1.  Vertebrae: Reactive marrow edema associated with the L4-L5 facet  joints, left worse than right. No fracture. No evidence of discitis.  No marrow replacing bone lesion.  Conus medullaris and cauda equina: Conus extends to the L1-L2 level.  Conus and cauda equina appear normal.  Paraspinal and other soft tissues: Cholelithiasis.   Disc levls:   T12-L1: Mild bilateral facet arthropathy without foraminal or canal  stenosis.   L1-L2: Moderate bilateral facet arthropathy without foraminal or  canal stenosis.   L2-L3: Moderate bilateral facet arthropathy and ligamentum flavum  buckling contributing to mild canal stenosis. No significant  foraminal stenosis.   L3-L4: Moderate bilateral facet arthropathy with ligamentum flavum  buckling. No significant foraminal or canal stenosis.   L4-L5: Advanced bilateral facet arthropathy with ligamentum flavum  buckling. Mild  annular disc bulge. Bilateral subarticular recess  stenosis with mild canal stenosis. Mild-moderate right and mild left  foraminal stenosis.   L5-S1: Moderate bilateral facet arthropathy. No canal stenosis. No  significant foraminal stenosis.   IMPRESSION:  1. Multilevel lumbar spondylosis most pronounced at L4-L5 where  there is mild canal stenosis with mild-moderate right and mild left  foraminal stenosis.  2. Mild canal stenosis at L2-L3.  3. Reactive marrow edema associated with the L4-L5 facet joints,  left worse than right, which can be a focal source of back pain.  4. Cholelithiasis.   Electronically Signed    By: Mabel Converse D.O.    On: 01/09/2024 10:16   Her pain is rated 10/10.  She is diabetic, her last Hemoglobin A1c on 10/09/2023 was 6.3.  Procedures: 02/13/2024: Right L5-S1 transforaminal ESI   IMPRESSION/RESULTS: Patient tolerated the procedure well without any complications. We used a 5 inch needle and 2% lidocaine.  Immediately after the procedure she reported 70% relief. She will follow up with Whitney in 3-4 weeks.      INFORMED CONSENT: The patient understood the potential risks and benefits of the procedure, which were explained to the patient prior to the procedure.  The patient read and signed the consent stating complete understanding of the information, and wished to proceed with the procedure, there were no barriers to understanding.  Ample time was given for any questions to be answered prior to the procedure. The risks of the procedure were explained including, but not limited to the risk of bleeding and/or infection into the spinal area, intervertebral discs, or epidural space; nerve injury; nerve irritation; reaction to medications; etc.  The patient denied any history of bleeding disorders, allergies to medications used or other medical contraindications to the procedure.  No promises  were given to any expected outcome.   PROCEDURE: A time out  was performed by physician and assistant.  Correct patient, procedure, plan of care, site, position, and equipment were verified.  The patient was sterilely prepped with a triple scrub of betadine solution and draped in the prone position.  Careful attention was paid to aseptic technique throughout the procedure.  Then, the Right L5-S1 neuroforamen was identified under fluoroscopic guidance.  The overlying skin and subcutaneous tissues were anesthetized with approximately 2 cc of 1% Xylocaine.  A 25 gauge spinal needle was inserted down into the foramen.  Approximately 2 cc of Omnipaque-240 mgI/mL contrast was infiltrated under real time-fluoroscopy demonstrating satisfactory spread along the exiting spinal nerve and into the epidural space without vascular uptake.  No aspirate was noted.  Spot films were taken.  A solution containing 1.0 cc of dexamethasone sodium phosphate 10 mg/cc and 0.5 cc of 2% preservative-free lidocaine was slowly infiltrated around the spinal nerve and into the epidural space under real-time fluoroscopy.  There was good contrast washout.  The needle was removed.  There were no complications.   DISCHARGE SUMMARY: The patient was monitored post-injection for 30 minutes in the recovery area and remained stable without evidence of complications.  After the procedure, the patient ambulated in and from the office without difficulty.  The patient was discharged with discharge instructions in stable condition.  Patient was instructed to contact us  with any problems.  Procedure fluoro time: 11 seconds. Fluoroscopy dose: 5.64 mGy.  This is below the dose threshold #1 (5000 mGy).    BENJAMIN CHARLES CHASNIS, DO

## 2024-02-16 ENCOUNTER — Ambulatory Visit: Admitting: Cardiology

## 2024-02-21 ENCOUNTER — Other Ambulatory Visit

## 2024-02-21 DIAGNOSIS — R7303 Prediabetes: Secondary | ICD-10-CM

## 2024-02-21 DIAGNOSIS — E782 Mixed hyperlipidemia: Secondary | ICD-10-CM

## 2024-02-21 DIAGNOSIS — I10 Essential (primary) hypertension: Secondary | ICD-10-CM

## 2024-02-21 DIAGNOSIS — E119 Type 2 diabetes mellitus without complications: Secondary | ICD-10-CM

## 2024-02-22 ENCOUNTER — Ambulatory Visit: Admitting: Cardiology

## 2024-02-22 ENCOUNTER — Ambulatory Visit: Payer: Self-pay | Admitting: Cardiology

## 2024-02-22 ENCOUNTER — Encounter: Payer: Self-pay | Admitting: Cardiology

## 2024-02-22 VITALS — BP 124/81 | HR 77 | Ht 67.0 in | Wt 200.2 lb

## 2024-02-22 DIAGNOSIS — E1159 Type 2 diabetes mellitus with other circulatory complications: Secondary | ICD-10-CM | POA: Diagnosis not present

## 2024-02-22 DIAGNOSIS — E1165 Type 2 diabetes mellitus with hyperglycemia: Secondary | ICD-10-CM | POA: Insufficient documentation

## 2024-02-22 DIAGNOSIS — I152 Hypertension secondary to endocrine disorders: Secondary | ICD-10-CM | POA: Insufficient documentation

## 2024-02-22 DIAGNOSIS — Z23 Encounter for immunization: Secondary | ICD-10-CM

## 2024-02-22 DIAGNOSIS — E1169 Type 2 diabetes mellitus with other specified complication: Secondary | ICD-10-CM | POA: Diagnosis not present

## 2024-02-22 DIAGNOSIS — E782 Mixed hyperlipidemia: Secondary | ICD-10-CM

## 2024-02-22 LAB — CMP14+EGFR
ALT: 19 IU/L (ref 0–32)
AST: 19 IU/L (ref 0–40)
Albumin: 4.5 g/dL (ref 3.8–4.8)
Alkaline Phosphatase: 105 IU/L (ref 49–135)
BUN/Creatinine Ratio: 27 (ref 12–28)
BUN: 25 mg/dL (ref 8–27)
Bilirubin Total: 0.8 mg/dL (ref 0.0–1.2)
CO2: 24 mmol/L (ref 20–29)
Calcium: 9.9 mg/dL (ref 8.7–10.3)
Chloride: 101 mmol/L (ref 96–106)
Creatinine, Ser: 0.94 mg/dL (ref 0.57–1.00)
Globulin, Total: 2.6 g/dL (ref 1.5–4.5)
Glucose: 110 mg/dL — ABNORMAL HIGH (ref 70–99)
Potassium: 4.4 mmol/L (ref 3.5–5.2)
Sodium: 141 mmol/L (ref 134–144)
Total Protein: 7.1 g/dL (ref 6.0–8.5)
eGFR: 65 mL/min/1.73 (ref >59)

## 2024-02-22 LAB — CBC WITH DIFFERENTIAL/PLATELET
Basophils Absolute: 0 x10E3/uL (ref 0.0–0.2)
Basos: 0 %
EOS (ABSOLUTE): 0.1 x10E3/uL (ref 0.0–0.4)
Eos: 2 %
Hematocrit: 38.4 % (ref 34.0–46.6)
Hemoglobin: 12.1 g/dL (ref 11.1–15.9)
Immature Grans (Abs): 0 x10E3/uL (ref 0.0–0.1)
Immature Granulocytes: 0 %
Lymphocytes Absolute: 3.1 x10E3/uL (ref 0.7–3.1)
Lymphs: 38 %
MCH: 28.9 pg (ref 26.6–33.0)
MCHC: 31.5 g/dL (ref 31.5–35.7)
MCV: 92 fL (ref 79–97)
Monocytes Absolute: 0.5 x10E3/uL (ref 0.1–0.9)
Monocytes: 6 %
Neutrophils Absolute: 4.4 x10E3/uL (ref 1.4–7.0)
Neutrophils: 54 %
Platelets: 285 x10E3/uL (ref 150–450)
RBC: 4.19 x10E6/uL (ref 3.77–5.28)
RDW: 13.6 % (ref 11.7–15.4)
WBC: 8.1 x10E3/uL (ref 3.4–10.8)

## 2024-02-22 LAB — LIPID PANEL
Chol/HDL Ratio: 2.9 ratio (ref 0.0–4.4)
Cholesterol, Total: 167 mg/dL (ref 100–199)
HDL: 58 mg/dL (ref >39)
LDL Chol Calc (NIH): 86 mg/dL (ref 0–99)
Triglycerides: 129 mg/dL (ref 0–149)
VLDL Cholesterol Cal: 23 mg/dL (ref 5–40)

## 2024-02-22 LAB — HEMOGLOBIN A1C
Est. average glucose Bld gHb Est-mCnc: 134 mg/dL
Hgb A1c MFr Bld: 6.3 % — ABNORMAL HIGH (ref 4.8–5.6)

## 2024-02-22 NOTE — Progress Notes (Signed)
 Established Patient Office Visit  Subjective:  Patient ID: Jody Huffman, female    DOB: September 12, 1950  Age: 73 y.o. MRN: [id number]  Chief Complaint  Patient presents with   Follow-up    4 month lab results. Sciatic pain.     Patient in office for 4 month follow up, discuss recent lab results. Patient doing well overall. Continues to have back pain. Seeing [facility], received a back injection on 02/13/24.  Discussed recent lab work. Hgb A1c stable, LDL stable, normal kidney function. Up to date on routine maintenance exams. Patient requesting a flu vaccine today.  Continue current medications.     No other concerns at this time.   Past Medical History:  Diagnosis Date   Chicken pox    Heart murmur    High cholesterol    Numbness and tingling 05/29/2012   Prediabetes 05/09/2022   Right foot pain 10/08/2013   Shoulder pain 11/20/2012   UTI (lower urinary tract infection)     Past Surgical History:  Procedure Laterality Date   ABDOMINAL HYSTERECTOMY  1996   BREAST BIOPSY Right 15+ yrs ago   neg   BREAST BIOPSY Left 15+yrs ago   neg   BREAST BIOPSY Right 2016   neg   Right breast cyst removed     In her 30s    Social History   Socioeconomic History   Marital status: Divorced    Spouse name: Not on file   Number of children: Not on file   Years of education: Not on file   Highest education level: Not on file  Occupational History   Not on file  Tobacco Use   Smoking status: Never   Smokeless tobacco: Never  Substance and Sexual Activity   Alcohol use: No    Alcohol/week: 0.0 standard drinks of alcohol   Drug use: No   Sexual activity: Not on file  Other Topics Concern   Not on file  Social History Narrative   Not on file   Social Drivers of Health   Tobacco Use: Low Risk (02/22/2024)   Patient History    Smoking Tobacco Use: Never    Smokeless Tobacco Use: Never    Passive Exposure: Not on file  Financial Resource Strain: Low Risk  (01/02/2024)    Received from [facility]   Overall Financial Resource Strain (CARDIA)    Difficulty of Paying Living Expenses: Not hard at all  Food Insecurity: No Food Insecurity (01/02/2024)   Received from [facility]   Epic    Within the past 12 months, you worried that your food would run out before you got the money to buy more.: Never true    Within the past 12 months, the food you bought just didn't last and you didn't have money to get more.: Never true  Transportation Needs: No Transportation Needs (01/02/2024)   Received from [facility]    In the past 12 months, has lack of transportation kept you from medical appointments or from getting medications?: No    Lack of Transportation (Non-Medical): No  Physical Activity: Not on file  Stress: Not on file  Social Connections: Not on file  Intimate Partner Violence: Not on file  Depression (EYV7-0): Not on file  Alcohol Screen: Not on file  Housing: Unknown (01/02/2024)   Received from [facility]   Epic    In the last 12 months, was there  a time when you were not able to pay the mortgage or rent on time?: No    Number of Times Moved in the Last Year: Not on file    At any time in the past 12 months, were you homeless or living in a shelter (including now)?: No  Utilities: Not At Risk (01/02/2024)   Received from [facility]   Epic    In the past 12 months has the electric, gas, oil, or water company threatened to shut off services in your home?: No  Health Literacy: Not on file    Family History  Problem Relation Age of Onset   Diabetes Mother    Kidney disease Mother 47       dialysis   Cancer Father 35       pancreatic cancer   Kidney disease Sister 19       dialysis   Diabetes Brother    Breast cancer Neg Hx     Allergies[1]  Show/hide medication list[2]  Review of Systems  Constitutional: Negative.    HENT: Negative.    Eyes: Negative.   Respiratory: Negative.  Negative for shortness of breath.   Cardiovascular: Negative.  Negative for chest pain.  Gastrointestinal: Negative.  Negative for abdominal pain, constipation and diarrhea.  Genitourinary: Negative.   Musculoskeletal:  Positive for back pain. Negative for joint pain and myalgias.  Skin: Negative.   Neurological: Negative.  Negative for dizziness and headaches.  Endo/Heme/Allergies: Negative.   All other systems reviewed and are negative.      Objective:   BP 124/81   Pulse 77   Ht 5' 7 (1.702 m)   Wt 200 lb 3.2 oz (90.8 kg)   SpO2 96%   BMI 31.36 kg/m   Vitals:   02/22/24 1027  BP: 124/81  Pulse: 77  Height: 5' 7 (1.702 m)  Weight: 200 lb 3.2 oz (90.8 kg)  SpO2: 96%  BMI (Calculated): 31.35    Physical Exam Vitals and nursing note reviewed.  Constitutional:      Appearance: Normal appearance. She is normal weight.  HENT:     Head: Normocephalic and atraumatic.     Nose: Nose normal.     Mouth/Throat:     Mouth: Mucous membranes are moist.  Eyes:     Extraocular Movements: Extraocular movements intact.     Conjunctiva/sclera: Conjunctivae normal.     Pupils: Pupils are equal, round, and reactive to light.  Cardiovascular:     Rate and Rhythm: Normal rate and regular rhythm.     Pulses: Normal pulses.     Heart sounds: Normal heart sounds.  Pulmonary:     Effort: Pulmonary effort is normal.     Breath sounds: Normal breath sounds.  Abdominal:     General: Abdomen is flat. Bowel sounds are normal.     Palpations: Abdomen is soft.  Musculoskeletal:        General: Normal range of motion.     Cervical back: Normal range of motion.  Skin:    General: Skin is warm and dry.  Neurological:     General: No focal deficit present.     Mental Status: She is alert and oriented to person, place, and time.  Psychiatric:        Mood and Affect: Mood normal.        Behavior: Behavior normal.         Thought Content: Thought content normal.  Judgment: Judgment normal.      No results found for any visits on 02/22/24.  Recent Results (from the past 2160 hours)  CMP14+EGFR     Status: Abnormal   Collection Time: 02/21/24  9:04 AM  Result Value Ref Range   Glucose 110 (H) 70 - 99 mg/dL   BUN 25 8 - 27 mg/dL   Creatinine, Ser 9.05 0.57 - 1.00 mg/dL   eGFR 65 >40 fO/fpw/8.26   BUN/Creatinine Ratio 27 12 - 28   Sodium 141 134 - 144 mmol/L   Potassium 4.4 3.5 - 5.2 mmol/L   Chloride 101 96 - 106 mmol/L   CO2 24 20 - 29 mmol/L   Calcium  9.9 8.7 - 10.3 mg/dL   Total Protein 7.1 6.0 - 8.5 g/dL   Albumin 4.5 3.8 - 4.8 g/dL   Globulin, Total 2.6 1.5 - 4.5 g/dL   Bilirubin Total 0.8 0.0 - 1.2 mg/dL   Alkaline Phosphatase 105 49 - 135 IU/L   AST 19 0 - 40 IU/L   ALT 19 0 - 32 IU/L  Lipid panel     Status: None   Collection Time: 02/21/24  9:04 AM  Result Value Ref Range   Cholesterol, Total 167 100 - 199 mg/dL   Triglycerides 870 0 - 149 mg/dL   HDL 58 >60 mg/dL   VLDL Cholesterol Cal 23 5 - 40 mg/dL   LDL Chol Calc (NIH) 86 0 - 99 mg/dL   Chol/HDL Ratio 2.9 0.0 - 4.4 ratio    Comment:                                   T. Chol/HDL Ratio                                             Men  Women                               1/2 Avg.Risk  3.4    3.3                                   Avg.Risk  5.0    4.4                                2X Avg.Risk  9.6    7.1                                3X Avg.Risk 23.4   11.0   CBC with Differential/Platelet     Status: None   Collection Time: 02/21/24  9:04 AM  Result Value Ref Range   WBC 8.1 3.4 - 10.8 x10E3/uL   RBC 4.19 3.77 - 5.28 x10E6/uL   Hemoglobin 12.1 11.1 - 15.9 g/dL   Hematocrit 61.5 65.9 - 46.6 %   MCV 92 79 - 97 fL   MCH 28.9 26.6 - 33.0 pg   MCHC 31.5 31.5 - 35.7 g/dL   RDW 86.3 88.2 - 84.5 %   Platelets 285 150 - 450 x10E3/uL  Neutrophils 54 Not Estab. %   Lymphs 38 Not Estab. %   Monocytes 6 Not Estab. %   Eos  2 Not Estab. %   Basos 0 Not Estab. %   Neutrophils Absolute 4.4 1.4 - 7.0 x10E3/uL   Lymphocytes Absolute 3.1 0.7 - 3.1 x10E3/uL   Monocytes Absolute 0.5 0.1 - 0.9 x10E3/uL   EOS (ABSOLUTE) 0.1 0.0 - 0.4 x10E3/uL   Basophils Absolute 0.0 0.0 - 0.2 x10E3/uL   Immature Granulocytes 0 Not Estab. %   Immature Grans (Abs) 0.0 0.0 - 0.1 x10E3/uL  Hemoglobin A1c     Status: Abnormal   Collection Time: 02/21/24  9:04 AM  Result Value Ref Range   Hgb A1c MFr Bld 6.3 (H) 4.8 - 5.6 %    Comment:          Prediabetes: 5.7 - 6.4          Diabetes: >6.4          Glycemic control for adults with diabetes: <7.0    Est. average glucose Bld gHb Est-mCnc 134 mg/dL      Assessment & Plan:  Flu vaccine today Continue current medications  Problem List Items Addressed This Visit       Cardiovascular and Mediastinum   Hypertension associated with diabetes (HCC)     Endocrine   Combined hyperlipidemia associated with type 2 diabetes mellitus (HCC) - Primary   Type 2 diabetes mellitus with hyperglycemia, without long-term current use of insulin (HCC)   Other Visit Diagnoses       Flu vaccine need       Relevant Orders   Flu vaccine HIGH DOSE PF(Fluzone Trivalent)       Return in about 4 months (around 06/22/2024) for fasting lab work prior.   Total time spent: 25 minutes. This time includes review of previous notes and results and patient face to face interaction during today's visit.    Jeoffrey Pollen, NP  02/22/2024   This document may have been prepared by Dragon Voice Recognition software and as such may include unintentional dictation errors.     [1]  Allergies Allergen Reactions   Bee Pollen   [2]  Outpatient Medications Prior to Visit  Medication Sig   amLODipine  (NORVASC ) 5 MG tablet Take 1 tablet (5 mg total) by mouth daily.   atorvastatin  (LIPITOR) 40 MG tablet Take 1 tablet (40 mg total) by mouth daily.   Calcium  Carbonate-Vitamin D  (CALCIUM -VITAMIN D3 PO) Take by  mouth.   cetirizine (ZYRTEC) 10 MG tablet Take 10 mg by mouth daily.   Chromium-Cinnamon (CINNAMON PLUS CHROMIUM PO) Take by mouth.   clotrimazole  (LOTRIMIN ) 1 % cream APPLY TO AFFECTED AREA TWICE A DAY   Cranberry-Vitamin C-Vitamin E (CRANBERRY PLUS VITAMIN C) 4200-20-3 MG-MG-UNIT CAPS Take by mouth.   dapagliflozin  propanediol (FARXIGA ) 10 MG TABS tablet Take 1 tablet (10 mg total) by mouth daily.   fluticasone  (FLONASE ) 50 MCG/ACT nasal spray Place 2 sprays into the nose daily.   JANUVIA 50 MG tablet TAKE 1 TABLET BY MOUTH EVERY DAY IN THE MORNING   losartan  (COZAAR ) 100 MG tablet Take 1 tablet (100 mg total) by mouth daily.   meloxicam  (MOBIC ) 15 MG tablet Take 1 tablet (15 mg total) by mouth daily.   Multiple Vitamins-Minerals (WOMENS MULTI VITAMIN & MINERAL PO) Take by mouth.   OMEGA-3 KRILL OIL PO Take by mouth.   Vitamin D , Ergocalciferol , (DRISDOL ) 1.25 MG (50000 UNIT) CAPS capsule Take 1 capsule (50,000 Units  total) by mouth once a week.   No facility-administered medications prior to visit.

## 2024-02-27 ENCOUNTER — Other Ambulatory Visit: Payer: Self-pay | Admitting: Cardiology

## 2024-03-11 ENCOUNTER — Encounter: Payer: Self-pay | Admitting: Cardiology

## 2024-03-13 NOTE — Progress Notes (Signed)
 Jody Huffman                                          MRN: [id number]   03/13/2024   The [facility] Quality Team Specialist reviewed this patient medical record for the purposes of chart review for care gap closure. The following were reviewed: abstraction for care gap closure-controlling blood pressure.    [facility] Quality Team

## 2024-03-22 ENCOUNTER — Other Ambulatory Visit: Payer: Self-pay | Admitting: Cardiology

## 2024-03-22 DIAGNOSIS — E559 Vitamin D deficiency, unspecified: Secondary | ICD-10-CM

## 2024-04-03 ENCOUNTER — Inpatient Hospital Stay
Admission: RE | Admit: 2024-04-03 | Discharge: 2024-04-03 | Disposition: A | Payer: Self-pay | Source: Ambulatory Visit | Attending: Neurosurgery | Admitting: Neurosurgery

## 2024-04-03 ENCOUNTER — Other Ambulatory Visit: Payer: Self-pay

## 2024-04-03 DIAGNOSIS — Z049 Encounter for examination and observation for unspecified reason: Secondary | ICD-10-CM

## 2024-04-03 NOTE — Progress Notes (Addendum)
 "   Referring Physician:  Carlisle Benton CROME, FNP [id number] [place],  [place] [address]  Primary Physician:  Carin Gauze, NP  History of Present Illness: 04/04/2024 Jody Huffman is here today with a chief complaint of lumbar radiculopathy and lumbar spondylosis who presents for evaluation of persistent right lower extremity pain.  She has severe right leg pain from the buttock to the ankle, worst in the morning. Pain is triggered by prolonged sitting, lying on the right side, and standing, and limits her to lying on the left side. She can walk short distances if she walks slowly and denies gait disturbance.  She has distal right leg paresthesias without numbness or sensory asymmetry.  She is in physical therapy, which helps. A prior lumbar injection gave marked relief for 2 days before pain returned to baseline. She is scheduled for another injection but may not attend because of expected bad weather.  She has diabetes that she reports is well controlled without glycemic issues.    Discussed the use of AI scribe software for clinical note transcription with the patient, who gave verbal consent to proceed. Bowel/Bladder Dysfunction: none  Conservative measures:  Physical therapy: currently doing PT at [facility], she is on her 5th week. Her initial evaluation was on 02/29/24.   Multimodal medical therapy including regular antiinflammatories:  meloxicam , tylenol, hydrocodone, tramadol Injections:  02/13/2024: Right L5-S1 transforaminal ESI (2 days of relief then return of pain)   Past Surgery: no spinal surgeries   Rock DELENA Eans has no symptoms of cervical myelopathy.  The symptoms are causing a significant impact on the patient's life.   I have utilized the care everywhere function in epic to review the outside records available from external health systems.   Review of Systems:  A 10 point review of systems is negative, except for the pertinent positives and  negatives detailed in the HPI.  Past Medical History: Past Medical History:  Diagnosis Date   Chicken pox    Heart murmur    High cholesterol    Numbness and tingling 05/29/2012   Prediabetes 05/09/2022   Right foot pain 10/08/2013   Shoulder pain 11/20/2012   UTI (lower urinary tract infection)     Past Surgical History: Past Surgical History:  Procedure Laterality Date   ABDOMINAL HYSTERECTOMY  1996   BREAST BIOPSY Right 15+ yrs ago   neg   BREAST BIOPSY Left 15+yrs ago   neg   BREAST BIOPSY Right 2016   neg   Right breast cyst removed     In her 30s    Allergies: Allergies as of 04/04/2024 - Review Complete 02/22/2024  Allergen Reaction Noted   Bee pollen  05/09/2022    Medications: Current Medications[1]  Social History: Social History[2]  Family Medical History: Family History  Problem Relation Age of Onset   Diabetes Mother    Kidney disease Mother 54       dialysis   Cancer Father 39       pancreatic cancer   Kidney disease Sister 46       dialysis   Diabetes Brother    Breast cancer Neg Hx     Physical Examination: There were no vitals filed for this visit.  General: Patient is in no apparent distress. Attention to examination is appropriate.  Neck:   Supple.  Full range of motion.  Respiratory: Patient is breathing without any difficulty.   NEUROLOGICAL:     Awake, alert, oriented to person,  place, and time.  Speech is clear and fluent.   Cranial Nerves: Pupils equal round and reactive to light.  Facial tone is symmetric.  Facial sensation is symmetric. Shoulder shrug is symmetric. Tongue protrusion is midline.  There is no pronator drift.  Strength: Side Biceps Triceps Deltoid Interossei Grip Wrist Ext. Wrist Flex.  R 5 5 5 5 5 5 5   L 5 5 5 5 5 5 5    Side Iliopsoas Quads Hamstring PF DF EHL  R 5 5 5 5 5 5   L 5 5 5 5 5 5    Reflexes are 1+ and symmetric at the biceps, triceps, brachioradialis, patella and achilles.   Hoffman's is  absent.   Bilateral upper and lower extremity sensation is intact to light touch.    No evidence of dysmetria noted.  Gait is normal.     Medical Decision Making  Imaging: MR L spine 01/05/2024 IMPRESSION: 1. Multilevel lumbar spondylosis most pronounced at L4-L5 where there is mild canal stenosis with mild-moderate right and mild left foraminal stenosis. 2. Mild canal stenosis at L2-L3. 3. Reactive marrow edema associated with the L4-L5 facet joints, left worse than right, which can be a focal source of back pain. 4. Cholelithiasis.     Electronically Signed   By: Mabel Converse D.O.   On: 01/09/2024 10:16  I have personally reviewed the images and agree with the above interpretation.  Assessment and Plan: Jody Huffman is a pleasant 74 y.o. female with possible right L5 radiculopathy.  There appears to be bilateral recess stenosis.  I will confirm with the reading radiologist.  She will continue physical therapy for now.  She has an injection scheduled next week.  We will touch base with her the week after to see how she is doing.  If she does not improve, we will consider right L5 laminoforaminotomy pending discussion with the radiologist.  I spent a total of 30 minutes in this patient's care today. This time was spent reviewing pertinent records including imaging studies, obtaining and confirming history, performing a directed evaluation, formulating and discussing my recommendations, and documenting the visit within the medical record.      Thank you for involving me in the care of this patient.      Demitrius Crass K. Clois MD, [facility]     [1]  Current Outpatient Medications:    amLODipine  (NORVASC ) 5 MG tablet, Take 1 tablet (5 mg total) by mouth daily., Disp: 90 tablet, Rfl: 3   atorvastatin  (LIPITOR) 40 MG tablet, Take 1 tablet (40 mg total) by mouth daily., Disp: 90 tablet, Rfl: 3   Calcium  Carbonate-Vitamin D  (CALCIUM -VITAMIN D3 PO), Take by mouth.,  Disp: , Rfl:    cetirizine (ZYRTEC) 10 MG tablet, Take 10 mg by mouth daily., Disp: , Rfl:    Chromium-Cinnamon (CINNAMON PLUS CHROMIUM PO), Take by mouth., Disp: , Rfl:    clotrimazole  (LOTRIMIN ) 1 % cream, APPLY TO AFFECTED AREA TWICE A DAY, Disp: 15 g, Rfl: 2   Cranberry-Vitamin C-Vitamin E (CRANBERRY PLUS VITAMIN C) 4200-20-3 MG-MG-UNIT CAPS, Take by mouth., Disp: , Rfl:    dapagliflozin  propanediol (FARXIGA ) 10 MG TABS tablet, Take 1 tablet (10 mg total) by mouth daily., Disp: 30 tablet, Rfl: 5   fluticasone  (FLONASE ) 50 MCG/ACT nasal spray, Place 2 sprays into the nose daily., Disp: 16 g, Rfl: 6   JANUVIA 50 MG tablet, TAKE 1 TABLET BY MOUTH EVERY DAY IN THE MORNING, Disp: 30 tablet, Rfl: 5   losartan  (  COZAAR ) 100 MG tablet, Take 1 tablet (100 mg total) by mouth daily., Disp: 90 tablet, Rfl: 3   meloxicam  (MOBIC ) 15 MG tablet, Take 1 tablet (15 mg total) by mouth daily., Disp: 30 tablet, Rfl: 5   Multiple Vitamins-Minerals (WOMENS MULTI VITAMIN & MINERAL PO), Take by mouth., Disp: , Rfl:    OMEGA-3 KRILL OIL PO, Take by mouth., Disp: , Rfl:    Vitamin D , Ergocalciferol , (DRISDOL ) 1.25 MG (50000 UNIT) CAPS capsule, TAKE 1 CAPSULE BY MOUTH ONE TIME PER WEEK, Disp: 12 capsule, Rfl: 0 [2]  Social History Tobacco Use   Smoking status: Never   Smokeless tobacco: Never  Substance Use Topics   Alcohol use: No    Alcohol/week: 0.0 standard drinks of alcohol   Drug use: No   "

## 2024-04-04 ENCOUNTER — Ambulatory Visit: Admitting: Neurosurgery

## 2024-04-04 ENCOUNTER — Encounter: Payer: Self-pay | Admitting: Neurosurgery

## 2024-04-04 VITALS — BP 110/84 | Ht 67.0 in | Wt 195.0 lb

## 2024-04-04 DIAGNOSIS — M5416 Radiculopathy, lumbar region: Secondary | ICD-10-CM

## 2024-04-18 ENCOUNTER — Telehealth: Payer: Self-pay

## 2024-04-18 NOTE — Telephone Encounter (Signed)
 I left a message for the patient to return my call to follow up on how she is doing. ESI with Dr. Avanell on 04/11/24 PT appt on 04/04/24

## 2024-04-18 NOTE — Telephone Encounter (Signed)
 Patient returned my call. She had her ESI injection on 04/11/2024 with Dr.Chasnis. She did have some relief from the injection and is more mobile than before; but she is still having some discomfort. She is still going to physical therapy but she feels that she is at a point that is it not helping much. She has not been discharged yet. So I did mention that once she is discharged from PT to call the office for an appointment with Dr.Yarbrough and she agreed.

## 2024-04-18 NOTE — Telephone Encounter (Signed)
-----   Message from Reeves Daisy, MD sent at 04/04/2024 11:19 AM EST ----- Please touch base with her about how she's doing after injection and finishing PT  thanks

## 2024-06-21 ENCOUNTER — Ambulatory Visit: Admitting: Cardiology
